# Patient Record
Sex: Female | Born: 1940 | Race: White | Hispanic: No | Marital: Married | Smoking: Former smoker
Health system: Southern US, Community
[De-identification: ages and names within clinical notes are randomized; demographics above are authoritative.]

## PROBLEM LIST (undated history)

## (undated) DIAGNOSIS — K529 Noninfective gastroenteritis and colitis, unspecified: Secondary | ICD-10-CM

## (undated) DIAGNOSIS — E119 Type 2 diabetes mellitus without complications: Secondary | ICD-10-CM

## (undated) DIAGNOSIS — E785 Hyperlipidemia, unspecified: Secondary | ICD-10-CM

## (undated) DIAGNOSIS — I10 Essential (primary) hypertension: Secondary | ICD-10-CM

## (undated) DIAGNOSIS — K219 Gastro-esophageal reflux disease without esophagitis: Secondary | ICD-10-CM

## (undated) DIAGNOSIS — N189 Chronic kidney disease, unspecified: Secondary | ICD-10-CM

## (undated) DIAGNOSIS — K828 Other specified diseases of gallbladder: Secondary | ICD-10-CM

## (undated) HISTORY — DX: Gastro-esophageal reflux disease without esophagitis: K21.9

## (undated) HISTORY — DX: Other specified diseases of gallbladder: K82.8

## (undated) HISTORY — DX: Essential (primary) hypertension: I10

## (undated) HISTORY — DX: Type 2 diabetes mellitus without complications: E11.9

## (undated) HISTORY — DX: Hyperlipidemia, unspecified: E78.5

## (undated) HISTORY — DX: Noninfective gastroenteritis and colitis, unspecified: K52.9

## (undated) HISTORY — DX: Chronic kidney disease, unspecified: N18.9

---

## 2012-09-09 ENCOUNTER — Emergency Department: Payer: Self-pay | Admitting: Emergency Medicine

## 2015-05-07 DIAGNOSIS — E7800 Pure hypercholesterolemia, unspecified: Secondary | ICD-10-CM | POA: Insufficient documentation

## 2016-07-21 DIAGNOSIS — E1129 Type 2 diabetes mellitus with other diabetic kidney complication: Secondary | ICD-10-CM | POA: Insufficient documentation

## 2016-08-04 DIAGNOSIS — K089 Disorder of teeth and supporting structures, unspecified: Secondary | ICD-10-CM | POA: Insufficient documentation

## 2017-08-10 DIAGNOSIS — N183 Chronic kidney disease, stage 3 unspecified: Secondary | ICD-10-CM | POA: Insufficient documentation

## 2018-05-17 DIAGNOSIS — S41102A Unspecified open wound of left upper arm, initial encounter: Secondary | ICD-10-CM | POA: Insufficient documentation

## 2018-09-20 DIAGNOSIS — L608 Other nail disorders: Secondary | ICD-10-CM | POA: Insufficient documentation

## 2018-10-27 DIAGNOSIS — Z7189 Other specified counseling: Secondary | ICD-10-CM | POA: Insufficient documentation

## 2019-12-21 ENCOUNTER — Other Ambulatory Visit: Payer: Self-pay

## 2019-12-21 ENCOUNTER — Ambulatory Visit: Payer: Medicare HMO | Admitting: Dermatology

## 2019-12-21 DIAGNOSIS — C44629 Squamous cell carcinoma of skin of left upper limb, including shoulder: Secondary | ICD-10-CM

## 2019-12-21 DIAGNOSIS — D0462 Carcinoma in situ of skin of left upper limb, including shoulder: Secondary | ICD-10-CM

## 2019-12-21 DIAGNOSIS — C4492 Squamous cell carcinoma of skin, unspecified: Secondary | ICD-10-CM

## 2019-12-21 DIAGNOSIS — C4491 Basal cell carcinoma of skin, unspecified: Secondary | ICD-10-CM

## 2019-12-21 DIAGNOSIS — C44619 Basal cell carcinoma of skin of left upper limb, including shoulder: Secondary | ICD-10-CM | POA: Diagnosis not present

## 2019-12-21 DIAGNOSIS — L821 Other seborrheic keratosis: Secondary | ICD-10-CM

## 2019-12-21 DIAGNOSIS — D485 Neoplasm of uncertain behavior of skin: Secondary | ICD-10-CM

## 2019-12-21 DIAGNOSIS — L578 Other skin changes due to chronic exposure to nonionizing radiation: Secondary | ICD-10-CM

## 2019-12-21 HISTORY — DX: Basal cell carcinoma of skin, unspecified: C44.91

## 2019-12-21 HISTORY — DX: Squamous cell carcinoma of skin, unspecified: C44.92

## 2019-12-21 MED ORDER — MUPIROCIN 2 % EX OINT: TOPICAL_OINTMENT | CUTANEOUS | 3 refills | Status: DC

## 2019-12-21 MED ORDER — DOXYCYCLINE HYCLATE 100 MG PO TABS: ORAL_TABLET | ORAL | 0 refills | Status: DC

## 2019-12-21 NOTE — Progress Notes (Signed)
New Patient Visit  Subjective  Michelle Guerrero is a 79 y.o. female who presents for the following: Skin Problem (Patient states that about a year ago she hit her left upper arm on a door and it still hasn't healed. She is being treated and follow by [facility]. Currently she is using a calcium alginate dressing on aa. ). She also has some crusted lesions on the left lower arm that she picks at and will not resolve.   Objective  Well appearing patient in no apparent distress; mood and affect are within normal limits.  Patient is very quiet and doesn't respond to questions.  Her daughter answers for her.  A focused examination was performed including the left arm. Relevant physical exam findings are noted in the Assessment and Plan.  Objective  left anterior shoulder/upper arm: 13.0 x 8.0 x 10.0 cm crusted hyperkeratotic  and friable deep ulceraton with raised, rolled borders.  Ulceration is down to muscle.      Objective  L mid forearm: 2.0 cm hyperkeratotic nodule with adjacent crusted hyperkeratotic plaque     Objective  L med forearm: 1 cm cutaneous horn     Assessment & Plan  Neoplasm of uncertain behavior of skin (3) left anterior shoulder/upper arm  Skin / nail biopsy Type of biopsy: tangential   Informed consent: discussed and consent obtained   Anesthesia: the lesion was anesthetized in a standard fashion   Anesthesia comment:  Area prepped with alcohol Anesthetic:  1% lidocaine w/ epinephrine 1-100,000 buffered w/ 8.4% NaHCO3 Instrument used: flexible razor blade   Hemostasis achieved with: pressure, aluminum chloride and electrodesiccation   Outcome: patient tolerated procedure well   Post-procedure details: wound care instructions given   Post-procedure details comment:  Ointment and small bandage applied Additional details:  Inferior edge of ulceration biopsied  doxycycline (VIBRA-TABS) 100 MG tablet  mupirocin ointment (BACTROBAN) 2 %  Specimen 1 - Surgical  pathology Differential Diagnosis: D48.5 SCC vs BCC Check Margins: No 13.0 x 8.0 x 10.0 cm crusted hyperkeratotic ulceraton  L mid forearm  Skin / nail biopsy Type of biopsy: tangential   Informed consent: discussed and consent obtained   Anesthesia: the lesion was anesthetized in a standard fashion   Anesthesia comment:  Area prepped with alcohol Anesthetic:  1% lidocaine w/ epinephrine 1-100,000 buffered w/ 8.4% NaHCO3 Instrument used: flexible razor blade   Hemostasis achieved with: pressure, aluminum chloride and electrodesiccation   Outcome: patient tolerated procedure well   Post-procedure details: wound care instructions given   Post-procedure details comment:  Ointment and small bandage applied Additional details:  Nodule biopsied- shave down to fat.  Adjacent plaque not biopsied  Specimen 2 - Surgical pathology Differential Diagnosis: D48.5 r/o SCC Check Margins: No 2.0 cm hyperkeratotic nodule  L med forearm  Skin / nail biopsy Type of biopsy: tangential   Informed consent: discussed and consent obtained   Anesthesia: the lesion was anesthetized in a standard fashion   Anesthesia comment:  Area prepped with alcohol Anesthetic:  1% lidocaine w/ epinephrine 1-100,000 buffered w/ 8.4% NaHCO3 Instrument used: flexible razor blade   Hemostasis achieved with: pressure, aluminum chloride and electrodesiccation   Outcome: patient tolerated procedure well   Post-procedure details: wound care instructions given   Post-procedure details comment:  Ointment and small bandage applied Additional details:  Cutaneous horn  Specimen 3 - Surgical pathology Differential Diagnosis: D48.5 r/o SCC Check Margins: No  Start Doxycycline 100mg  po BID take with food and plenty of  drink.  High risk of systemic infection due to depth/size of ulcer.  Start Mupirocin 2% ointment to aa's QD.  Will contact patient with pathology results and plan next steps pending those results.  If BCC,  consider Erivedge to shrink tumor.  According to daughter, patient has a h/o Mohs surgery on her nose, but never went back for the repair due to her fear of surgery, so she has a hole on her nose that she covers with a bandaid.  Patient declined examination of face today (wearing mask).   Will need to refer patient if skin cancer to [facility] for multidisciplinary cancer treatment evaluation. (Surg/Chemo/Radiation oncology).  Patient would like our office to contact her daughter [name] [phone].   Actinic Damage - diffuse scaly erythematous macules with underlying dyspigmentation - Recommend daily broad spectrum sunscreen SPF 30+ to sun-exposed areas, reapply every 2 hours as needed.  - Call for new or changing lesions.  Seborrheic Keratoses - Stuck-on, waxy, tan-brown papules and plaques  - Discussed benign etiology and prognosis. - Observe - Call for any changes  Return pending pathology results - will contact patient with next steps.   Luther Redo, CMA, am acting as scribe for Brendolyn Patty, MD .

## 2019-12-21 NOTE — Patient Instructions (Signed)

## 2019-12-26 ENCOUNTER — Other Ambulatory Visit: Payer: Self-pay

## 2019-12-26 ENCOUNTER — Telehealth: Payer: Self-pay

## 2019-12-26 DIAGNOSIS — C44619 Basal cell carcinoma of skin of left upper limb, including shoulder: Secondary | ICD-10-CM

## 2019-12-26 DIAGNOSIS — C4491 Basal cell carcinoma of skin, unspecified: Secondary | ICD-10-CM | POA: Insufficient documentation

## 2019-12-26 NOTE — Telephone Encounter (Signed)
Spoke to pts daughter and advised her of bx results.  Advised I sent referral to [facility] for them to evaluate and txt skin cancers.  Scheduled pt for 78m f/u./sh

## 2019-12-26 NOTE — Progress Notes (Signed)
Referral for Oncology.

## 2019-12-26 NOTE — Telephone Encounter (Signed)
-----   Message from Brendolyn Patty, MD sent at 12/25/2019  6:04 PM EDT ----- 1. 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 Michelle Guerrero- Patient needs appointment with oncologist at [facility] cancer center for treatment evaluation, possible Erivedge and/or radiation to shrink tumor. Possible surgery, but would be complicated due to large size and depth and may require plastics for reconstruction. 2. SCCs- multiple on L forearm.  Appointment as above- possible radiation due to extensive involvement. She will likely need a multidisciplinary team to address and I feel it is best for the cancer center to manage that process.  I would like her to also f/up with me in 2-3 months.

## 2020-01-01 ENCOUNTER — Telehealth: Payer: Self-pay | Admitting: *Deleted

## 2020-01-01 NOTE — Telephone Encounter (Signed)
Attempted to call patient multiple times to give appointment, no answer and voicemail full.   Appt. Printed and mailed.

## 2020-01-08 ENCOUNTER — Other Ambulatory Visit: Payer: Self-pay

## 2020-01-08 ENCOUNTER — Ambulatory Visit
Admission: RE | Admit: 2020-01-08 | Discharge: 2020-01-08 | Disposition: A | Discharge status: Home / Self Care | Payer: Medicare HMO | Source: Ambulatory Visit | Attending: Radiation Oncology | Admitting: Radiation Oncology

## 2020-01-08 ENCOUNTER — Encounter: Payer: Self-pay | Admitting: Radiation Oncology

## 2020-01-08 VITALS — BP 126/69 | HR 84 | Temp 96.9°F | Wt 179.0 lb

## 2020-01-08 DIAGNOSIS — C44619 Basal cell carcinoma of skin of left upper limb, including shoulder: Secondary | ICD-10-CM

## 2020-01-08 NOTE — Consult Note (Signed)
NEW PATIENT EVALUATION  Name: Michelle Guerrero  MRN: [id number]  Date:   01/08/2020     DOB: [birth year]   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  REFERRING PHYSICIAN: Nanda Quinton, MD  CHIEF COMPLAINT:  Chief Complaint  Patient presents with  . Consult    DIAGNOSIS: There were no encounter diagnoses.   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  She is a large ulcerative deep lesion in the left shoulder.  Patient is using appropriate medications although referred for possible radiation therapy to these areas.  PLANNED TREATMENT REGIMEN: Electron-beam therapy  PAST MEDICAL HISTORY:  has no past medical history on file.    PAST SURGICAL HISTORY:   FAMILY HISTORY: family history is not on file.  SOCIAL HISTORY:  reports that she has quit smoking. She has never used smokeless tobacco. She reports previous alcohol use. She reports previous drug use.  ALLERGIES: Penicillins and Montelukast  MEDICATIONS:  Current Outpatient Medications  Medication Sig Dispense Refill  . amLODipine (NORVASC) 5 MG tablet Take 5 mg by mouth daily.    . hydrochlorothiazide (HYDRODIURIL) 25 MG tablet Take 25 mg by mouth daily.    Marland Kitchen lisinopril (ZESTRIL) 40 MG tablet Take 1 tablet by mouth daily.    . mupirocin ointment (BACTROBAN) 2 % Apply to affected areas of wounds QD after cleaning. 30 g 3  . simvastatin (ZOCOR) 40 MG tablet Take 40 mg by mouth at bedtime.    . sodium bicarbonate 650 MG tablet Take 650 mg by mouth 2 (two) times daily.    Marland Kitchen doxycycline (VIBRA-TABS) 100 MG tablet Take one tab po BID for 2 weeks. Take with food. (Patient not taking: Reported on  01/08/2020) 28 tablet 0   No current facility-administered medications for this encounter.    ECOG PERFORMANCE STATUS:  1 - Symptomatic but completely ambulatory  REVIEW OF SYSTEMS: Patient denies any weight loss, fatigue, weakness, fever, chills or night sweats. Patient denies any loss of vision, blurred vision. Patient denies any ringing  of the ears or hearing loss. No irregular heartbeat. Patient denies heart murmur or history of fainting. Patient denies any chest pain or pain radiating to her upper extremities. Patient denies any shortness of breath, difficulty breathing at night, cough or hemoptysis. Patient denies any swelling in the lower legs. Patient denies any nausea vomiting, vomiting of blood, or coffee ground material in the vomitus. Patient denies any stomach pain. Patient states has had normal bowel movements no significant constipation or diarrhea. Patient denies any dysuria, hematuria or significant nocturia. Patient denies any problems walking, swelling in the joints or loss of balance. Patient denies any skin changes, loss of hair or loss of weight. Patient denies any excessive worrying or anxiety or significant depression. Patient denies any problems with insomnia. Patient denies excessive thirst, polyuria, polydipsia. Patient denies any swollen glands, patient denies easy bruising or easy bleeding. Patient denies any recent infections, allergies or URI. Patient "s visual fields have not changed significantly in recent time.   PHYSICAL EXAM: BP 126/69 (BP Location: Right Arm, Patient Position: Sitting, Cuff Size: Normal)   Pulse 84   Temp (!) 96.9 F (36.1 C) (Tympanic)  Wt 179 lb (81.2 kg)  Patient is an ulcerative large lesion measuring at least 10 cm greatest dimension.  Lesion is ulcerated down to fascia.  No evidence of supraclavicular or left axillary adenopathy is noted.  She also has several lesions on the left forearm recently biopsied again positive for squamous cell  carcinoma.  Well-developed well-nourished patient in NAD. HEENT reveals PERLA, EOMI, discs not visualized.  Oral cavity is clear. No oral mucosal lesions are identified. Neck is clear without evidence of cervical or supraclavicular adenopathy. Lungs are clear to A&P. Cardiac examination is essentially unremarkable with regular rate and rhythm without murmur rub or thrill. Abdomen is benign with no organomegaly or masses noted. Motor sensory and DTR levels are equal and symmetric in the upper and lower extremities. Cranial nerves II through XII are grossly intact. Proprioception is intact. No peripheral adenopathy or edema is identified. No motor or sensory levels are noted. Crude visual fields are within normal range.  LABORATORY DATA: Pathology report reviewed    RADIOLOGY RESULTS: No current films for review   IMPRESSION: Locally advanced basal cell carcinoma left shoulder as well as squamous cell carcinoma the left forearm in 79 year old female  PLAN: At this time like to attempt radiation therapy to both areas.  Would plan to delivering 5000 cGy to both areas and evaluate for response.  After 1 month would reevaluate both areas for possible additional radiation.  Risks and benefits of treatment including skin reaction fatigue alteration of blood counts possible lymphedema of her upper extremity all were described in detail to the patient and her daughter.  They both seem to comprehend my treatment plan well.  I personally set up and ordered CT simulation in 1 week.  I would like to take this opportunity to thank you for allowing me to participate in the care of your patient.Noreene Filbert, MD

## 2020-01-14 ENCOUNTER — Ambulatory Visit
Admission: RE | Admit: 2020-01-14 | Discharge: 2020-01-14 | Disposition: A | Discharge status: Home / Self Care | Payer: Medicare HMO | Source: Ambulatory Visit | Attending: Radiation Oncology | Admitting: Radiation Oncology

## 2020-01-14 DIAGNOSIS — C44619 Basal cell carcinoma of skin of left upper limb, including shoulder: Secondary | ICD-10-CM | POA: Insufficient documentation

## 2020-01-14 DIAGNOSIS — Z510 Encounter for antineoplastic radiation therapy: Secondary | ICD-10-CM | POA: Insufficient documentation

## 2020-01-16 DIAGNOSIS — Z510 Encounter for antineoplastic radiation therapy: Secondary | ICD-10-CM | POA: Diagnosis not present

## 2020-01-21 ENCOUNTER — Ambulatory Visit
Admission: RE | Admit: 2020-01-21 | Discharge: 2020-01-21 | Disposition: A | Discharge status: Home / Self Care | Payer: Medicare HMO | Source: Ambulatory Visit | Attending: Radiation Oncology | Admitting: Radiation Oncology

## 2020-01-21 DIAGNOSIS — Z510 Encounter for antineoplastic radiation therapy: Secondary | ICD-10-CM | POA: Diagnosis not present

## 2020-01-22 ENCOUNTER — Ambulatory Visit: Payer: Medicare HMO

## 2020-01-23 ENCOUNTER — Ambulatory Visit
Admission: RE | Admit: 2020-01-23 | Discharge: 2020-01-23 | Disposition: A | Discharge status: Home / Self Care | Payer: Medicare HMO | Source: Ambulatory Visit | Attending: Radiation Oncology | Admitting: Radiation Oncology

## 2020-01-23 DIAGNOSIS — Z510 Encounter for antineoplastic radiation therapy: Secondary | ICD-10-CM | POA: Diagnosis not present

## 2020-01-24 ENCOUNTER — Ambulatory Visit
Admission: RE | Admit: 2020-01-24 | Discharge: 2020-01-24 | Disposition: A | Discharge status: Home / Self Care | Payer: Medicare HMO | Source: Ambulatory Visit | Attending: Radiation Oncology | Admitting: Radiation Oncology

## 2020-01-24 DIAGNOSIS — Z510 Encounter for antineoplastic radiation therapy: Secondary | ICD-10-CM | POA: Diagnosis not present

## 2020-01-25 ENCOUNTER — Ambulatory Visit
Admission: RE | Admit: 2020-01-25 | Discharge: 2020-01-25 | Disposition: A | Discharge status: Home / Self Care | Payer: Medicare HMO | Source: Ambulatory Visit | Attending: Radiation Oncology | Admitting: Radiation Oncology

## 2020-01-25 DIAGNOSIS — Z510 Encounter for antineoplastic radiation therapy: Secondary | ICD-10-CM | POA: Diagnosis not present

## 2020-01-28 ENCOUNTER — Ambulatory Visit
Admission: RE | Admit: 2020-01-28 | Discharge: 2020-01-28 | Disposition: A | Discharge status: Home / Self Care | Payer: Medicare HMO | Source: Ambulatory Visit | Attending: Radiation Oncology | Admitting: Radiation Oncology

## 2020-01-28 DIAGNOSIS — Z510 Encounter for antineoplastic radiation therapy: Secondary | ICD-10-CM | POA: Diagnosis not present

## 2020-01-29 ENCOUNTER — Ambulatory Visit
Admission: RE | Admit: 2020-01-29 | Discharge: 2020-01-29 | Disposition: A | Discharge status: Home / Self Care | Payer: Medicare HMO | Source: Ambulatory Visit | Attending: Radiation Oncology | Admitting: Radiation Oncology

## 2020-01-29 DIAGNOSIS — Z510 Encounter for antineoplastic radiation therapy: Secondary | ICD-10-CM | POA: Diagnosis not present

## 2020-01-30 ENCOUNTER — Ambulatory Visit
Admission: RE | Admit: 2020-01-30 | Discharge: 2020-01-30 | Disposition: A | Discharge status: Home / Self Care | Payer: Medicare HMO | Source: Ambulatory Visit | Attending: Radiation Oncology | Admitting: Radiation Oncology

## 2020-01-30 DIAGNOSIS — Z510 Encounter for antineoplastic radiation therapy: Secondary | ICD-10-CM | POA: Diagnosis not present

## 2020-01-31 ENCOUNTER — Ambulatory Visit
Admission: RE | Admit: 2020-01-31 | Discharge: 2020-01-31 | Disposition: A | Discharge status: Home / Self Care | Payer: Medicare HMO | Source: Ambulatory Visit | Attending: Radiation Oncology | Admitting: Radiation Oncology

## 2020-01-31 DIAGNOSIS — Z510 Encounter for antineoplastic radiation therapy: Secondary | ICD-10-CM | POA: Diagnosis not present

## 2020-02-01 ENCOUNTER — Ambulatory Visit
Admission: RE | Admit: 2020-02-01 | Discharge: 2020-02-01 | Disposition: A | Discharge status: Home / Self Care | Payer: Medicare HMO | Source: Ambulatory Visit | Attending: Radiation Oncology | Admitting: Radiation Oncology

## 2020-02-01 DIAGNOSIS — Z510 Encounter for antineoplastic radiation therapy: Secondary | ICD-10-CM | POA: Diagnosis not present

## 2020-02-05 ENCOUNTER — Ambulatory Visit
Admission: RE | Admit: 2020-02-05 | Discharge: 2020-02-05 | Disposition: A | Discharge status: Home / Self Care | Payer: Medicare HMO | Source: Ambulatory Visit | Attending: Radiation Oncology | Admitting: Radiation Oncology

## 2020-02-05 DIAGNOSIS — C44619 Basal cell carcinoma of skin of left upper limb, including shoulder: Secondary | ICD-10-CM | POA: Insufficient documentation

## 2020-02-05 DIAGNOSIS — Z510 Encounter for antineoplastic radiation therapy: Secondary | ICD-10-CM | POA: Diagnosis not present

## 2020-02-06 ENCOUNTER — Ambulatory Visit
Admission: RE | Admit: 2020-02-06 | Discharge: 2020-02-06 | Disposition: A | Discharge status: Home / Self Care | Payer: Medicare HMO | Source: Ambulatory Visit | Attending: Radiation Oncology | Admitting: Radiation Oncology

## 2020-02-06 DIAGNOSIS — Z510 Encounter for antineoplastic radiation therapy: Secondary | ICD-10-CM | POA: Diagnosis not present

## 2020-02-07 ENCOUNTER — Ambulatory Visit
Admission: RE | Admit: 2020-02-07 | Discharge: 2020-02-07 | Disposition: A | Discharge status: Home / Self Care | Payer: Medicare HMO | Source: Ambulatory Visit | Attending: Radiation Oncology | Admitting: Radiation Oncology

## 2020-02-07 DIAGNOSIS — Z510 Encounter for antineoplastic radiation therapy: Secondary | ICD-10-CM | POA: Diagnosis not present

## 2020-02-08 ENCOUNTER — Ambulatory Visit
Admission: RE | Admit: 2020-02-08 | Discharge: 2020-02-08 | Disposition: A | Discharge status: Home / Self Care | Payer: Medicare HMO | Source: Ambulatory Visit | Attending: Radiation Oncology | Admitting: Radiation Oncology

## 2020-02-08 DIAGNOSIS — Z510 Encounter for antineoplastic radiation therapy: Secondary | ICD-10-CM | POA: Diagnosis not present

## 2020-02-11 ENCOUNTER — Ambulatory Visit
Admission: RE | Admit: 2020-02-11 | Discharge: 2020-02-11 | Disposition: A | Discharge status: Home / Self Care | Payer: Medicare HMO | Source: Ambulatory Visit | Attending: Radiation Oncology | Admitting: Radiation Oncology

## 2020-02-11 DIAGNOSIS — Z510 Encounter for antineoplastic radiation therapy: Secondary | ICD-10-CM | POA: Diagnosis not present

## 2020-02-12 ENCOUNTER — Ambulatory Visit
Admission: RE | Admit: 2020-02-12 | Discharge: 2020-02-12 | Disposition: A | Discharge status: Home / Self Care | Payer: Medicare HMO | Source: Ambulatory Visit | Attending: Radiation Oncology | Admitting: Radiation Oncology

## 2020-02-12 DIAGNOSIS — Z510 Encounter for antineoplastic radiation therapy: Secondary | ICD-10-CM | POA: Diagnosis not present

## 2020-02-13 ENCOUNTER — Ambulatory Visit
Admission: RE | Admit: 2020-02-13 | Discharge: 2020-02-13 | Disposition: A | Discharge status: Home / Self Care | Payer: Medicare HMO | Source: Ambulatory Visit | Attending: Radiation Oncology | Admitting: Radiation Oncology

## 2020-02-13 DIAGNOSIS — Z510 Encounter for antineoplastic radiation therapy: Secondary | ICD-10-CM | POA: Diagnosis not present

## 2020-02-14 ENCOUNTER — Ambulatory Visit
Admission: RE | Admit: 2020-02-14 | Discharge: 2020-02-14 | Disposition: A | Discharge status: Home / Self Care | Payer: Medicare HMO | Source: Ambulatory Visit | Attending: Radiation Oncology | Admitting: Radiation Oncology

## 2020-02-14 DIAGNOSIS — Z510 Encounter for antineoplastic radiation therapy: Secondary | ICD-10-CM | POA: Diagnosis not present

## 2020-02-15 ENCOUNTER — Ambulatory Visit
Admission: RE | Admit: 2020-02-15 | Discharge: 2020-02-15 | Disposition: A | Discharge status: Home / Self Care | Payer: Medicare HMO | Source: Ambulatory Visit | Attending: Radiation Oncology | Admitting: Radiation Oncology

## 2020-02-15 DIAGNOSIS — Z510 Encounter for antineoplastic radiation therapy: Secondary | ICD-10-CM | POA: Diagnosis not present

## 2020-02-17 ENCOUNTER — Ambulatory Visit: Payer: Medicare HMO

## 2020-02-18 ENCOUNTER — Ambulatory Visit: Payer: Self-pay | Admitting: Dermatology

## 2020-02-18 ENCOUNTER — Ambulatory Visit
Admission: RE | Admit: 2020-02-18 | Discharge: 2020-02-18 | Disposition: A | Discharge status: Home / Self Care | Payer: Medicare HMO | Source: Ambulatory Visit | Attending: Radiation Oncology | Admitting: Radiation Oncology

## 2020-02-18 DIAGNOSIS — Z510 Encounter for antineoplastic radiation therapy: Secondary | ICD-10-CM | POA: Diagnosis not present

## 2020-02-19 ENCOUNTER — Ambulatory Visit
Admission: RE | Admit: 2020-02-19 | Discharge: 2020-02-19 | Disposition: A | Discharge status: Home / Self Care | Payer: Medicare HMO | Source: Ambulatory Visit | Attending: Radiation Oncology | Admitting: Radiation Oncology

## 2020-02-19 DIAGNOSIS — Z510 Encounter for antineoplastic radiation therapy: Secondary | ICD-10-CM | POA: Diagnosis not present

## 2020-02-20 ENCOUNTER — Ambulatory Visit
Admission: RE | Admit: 2020-02-20 | Discharge: 2020-02-20 | Disposition: A | Discharge status: Home / Self Care | Payer: Medicare HMO | Source: Ambulatory Visit | Attending: Radiation Oncology | Admitting: Radiation Oncology

## 2020-02-20 DIAGNOSIS — Z510 Encounter for antineoplastic radiation therapy: Secondary | ICD-10-CM | POA: Diagnosis not present

## 2020-02-21 ENCOUNTER — Ambulatory Visit
Admission: RE | Admit: 2020-02-21 | Discharge: 2020-02-21 | Disposition: A | Discharge status: Home / Self Care | Payer: Medicare HMO | Source: Ambulatory Visit | Attending: Radiation Oncology | Admitting: Radiation Oncology

## 2020-02-21 DIAGNOSIS — Z510 Encounter for antineoplastic radiation therapy: Secondary | ICD-10-CM | POA: Diagnosis not present

## 2020-02-22 ENCOUNTER — Ambulatory Visit
Admission: RE | Admit: 2020-02-22 | Discharge: 2020-02-22 | Disposition: A | Discharge status: Home / Self Care | Payer: Medicare HMO | Source: Ambulatory Visit | Attending: Radiation Oncology | Admitting: Radiation Oncology

## 2020-02-22 DIAGNOSIS — Z510 Encounter for antineoplastic radiation therapy: Secondary | ICD-10-CM | POA: Diagnosis not present

## 2020-02-25 ENCOUNTER — Ambulatory Visit: Payer: Medicare HMO

## 2020-02-25 ENCOUNTER — Ambulatory Visit
Admission: RE | Admit: 2020-02-25 | Discharge: 2020-02-25 | Disposition: A | Discharge status: Home / Self Care | Payer: Medicare HMO | Source: Ambulatory Visit | Attending: Radiation Oncology | Admitting: Radiation Oncology

## 2020-02-25 DIAGNOSIS — Z510 Encounter for antineoplastic radiation therapy: Secondary | ICD-10-CM | POA: Diagnosis not present

## 2020-02-26 ENCOUNTER — Ambulatory Visit
Admission: RE | Admit: 2020-02-26 | Discharge: 2020-02-26 | Disposition: A | Discharge status: Home / Self Care | Payer: Medicare HMO | Source: Ambulatory Visit | Attending: Radiation Oncology | Admitting: Radiation Oncology

## 2020-02-26 DIAGNOSIS — Z510 Encounter for antineoplastic radiation therapy: Secondary | ICD-10-CM | POA: Diagnosis not present

## 2020-03-13 ENCOUNTER — Ambulatory Visit: Payer: Medicare HMO | Admitting: Dermatology

## 2020-04-02 ENCOUNTER — Other Ambulatory Visit: Payer: Self-pay

## 2020-04-02 DIAGNOSIS — I10 Essential (primary) hypertension: Secondary | ICD-10-CM | POA: Insufficient documentation

## 2020-04-02 DIAGNOSIS — Z85828 Personal history of other malignant neoplasm of skin: Secondary | ICD-10-CM | POA: Insufficient documentation

## 2020-04-03 ENCOUNTER — Encounter: Payer: Self-pay | Admitting: Radiation Oncology

## 2020-04-03 ENCOUNTER — Ambulatory Visit
Admission: RE | Admit: 2020-04-03 | Discharge: 2020-04-03 | Disposition: A | Discharge status: Home / Self Care | Payer: Medicare HMO | Source: Ambulatory Visit | Attending: Radiation Oncology | Admitting: Radiation Oncology

## 2020-04-03 VITALS — BP 145/59 | HR 92 | Temp 97.9°F | Wt 181.4 lb

## 2020-04-03 DIAGNOSIS — Z923 Personal history of irradiation: Secondary | ICD-10-CM | POA: Diagnosis not present

## 2020-04-03 DIAGNOSIS — C44619 Basal cell carcinoma of skin of left upper limb, including shoulder: Secondary | ICD-10-CM | POA: Diagnosis not present

## 2020-04-03 NOTE — Progress Notes (Signed)
Radiation Oncology Follow up Note  Name: Michelle Guerrero   Date:   04/03/2020 MRN:  [id number] DOB: 15-Sep-1940    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  REFERRING PROVIDER: Nanda Quinton, MD  HPI: Patient is a 79 year old female now seen at 1 month having completed radiation therapy to her left upper extremity she had a large ulcerated mass in the left anterior shoulder as well as left forearm for skin cancer as described above.  She is doing well pain has resolved.  She has an open wound with attempted granulation tissue.  The forearm lesion lesion is has granulation tissue present both areas of had a good response..  COMPLICATIONS OF TREATMENT: none  FOLLOW UP COMPLIANCE: keeps appointments   PHYSICAL EXAM:  BP (!) 145/59 (BP Location: Right Arm, Patient Position: Sitting, Cuff Size: Normal)   Pulse 92   Temp 97.9 F (36.6 C) (Tympanic)   Wt 181 lb 6.4 oz (82.3 kg)  Patient is an open wound in the left upper shoulder with crusting around the perimeter of the lesion consistent with treatment response.  Well-developed well-nourished patient in NAD. HEENT reveals PERLA, EOMI, discs not visualized.  Oral cavity is clear. No oral mucosal lesions are identified. Neck is clear without evidence of cervical or supraclavicular adenopathy. Lungs are clear to A&P. Cardiac examination is essentially unremarkable with regular rate and rhythm without murmur rub or thrill. Abdomen is benign with no organomegaly or masses noted. Motor sensory and DTR levels are equal and symmetric in the upper and lower extremities. Cranial nerves II through XII are grossly intact. Proprioception is intact. No peripheral adenopathy or edema is identified. No motor or sensory levels are noted. Crude visual fields are within normal range.  RADIOLOGY RESULTS: No  current films to review  PLAN: This time patient will continue follow-up care with her dermatologist.  I have recommended possible wound clinic for this woman will let that be recommendation of dermatology.  I have asked to see her back in 3 months she knows to call should she see any progression of disease at this time.  I suggested she keep the area nonbandaged to promote healing.  I would like to take this opportunity to thank you for allowing me to participate in the care of your patient.Noreene Filbert, MD

## 2020-04-08 ENCOUNTER — Other Ambulatory Visit: Payer: Self-pay

## 2020-04-08 ENCOUNTER — Ambulatory Visit: Payer: Medicare HMO | Admitting: Dermatology

## 2020-04-08 ENCOUNTER — Encounter: Payer: Self-pay | Admitting: Dermatology

## 2020-04-08 DIAGNOSIS — C44619 Basal cell carcinoma of skin of left upper limb, including shoulder: Secondary | ICD-10-CM | POA: Diagnosis not present

## 2020-04-08 DIAGNOSIS — C44629 Squamous cell carcinoma of skin of left upper limb, including shoulder: Secondary | ICD-10-CM | POA: Diagnosis not present

## 2020-04-08 DIAGNOSIS — L98499 Non-pressure chronic ulcer of skin of other sites with unspecified severity: Secondary | ICD-10-CM

## 2020-04-08 DIAGNOSIS — D485 Neoplasm of uncertain behavior of skin: Secondary | ICD-10-CM

## 2020-04-08 DIAGNOSIS — C4492 Squamous cell carcinoma of skin, unspecified: Secondary | ICD-10-CM

## 2020-04-08 MED ORDER — MUPIROCIN 2 % EX OINT: TOPICAL_OINTMENT | CUTANEOUS | 4 refills | Status: AC

## 2020-04-08 MED ORDER — DOXYCYCLINE MONOHYDRATE 100 MG PO TABS: 100.0000 mg | ORAL_TABLET | Freq: Two times a day (BID) | ORAL | 1 refills | Status: DC

## 2020-04-08 NOTE — Patient Instructions (Addendum)
Recommend daily broad spectrum sunscreen SPF 30+ to sun-exposed areas, reapply every 2 hours as needed. Call for new or changing lesions.    [employer] Wound Cleanser 16 oz. NonSterile Spray Bottle 1 Each

## 2020-04-08 NOTE — Progress Notes (Signed)
   Follow-Up Visit   Subjective  Michelle Guerrero is a 79 y.o. female who presents for the following: Follow-up (BCC/SCC post radiation treatment).  Patient presents today for follow up [place] and SCC post radiation treatment on Left arm.  The following portions of the chart were reviewed this encounter and updated as appropriate:      Review of Systems:  No other skin or systemic complaints except as noted in HPI or Assessment and Plan.  Objective  Well appearing patient in no apparent distress; mood and affect are within normal limits.  A focused examination was performed including Left Arm. Relevant physical exam findings are noted in the Assessment and Plan.  Objective  Left Upper anterior shoudler /upper arm: Large ulceration 10 cm with thick adherent yellow crusting at edges     Objective  Left Forearm - Anterior: Erythma with adjacent think yellow brown crusting 5 cm      Assessment & Plan  Basal cell carcinoma (BCC) of skin of left upper extremity including shoulder Left Upper anterior shoudler /upper arm  doxycycline (ADOXA) 100 MG tablet  Improving post radiation, large Ulceration starting to heal with secondary infection at edges Start Doxycycline 100 mg PO bid  x 2 weeks Start Mupirocin ointment bid  Apply warm wet compress twice daily let sit for 30 mins and rub what comes off, do not pull crust off, don't make bleed then apply mupirocin around wound where crusted and then cover inside of wound with the calcium alginate then cover with ABD pad, gauze and then coban  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   Marene Lenz, CMA, am acting as scribe for Brendolyn Patty, MD .  Documentation: I have reviewed the above documentation for accuracy and completeness, and I agree with the above.  Brendolyn Patty MD

## 2020-05-13 ENCOUNTER — Ambulatory Visit: Payer: Medicare HMO | Admitting: Dermatology

## 2020-05-13 ENCOUNTER — Other Ambulatory Visit: Payer: Self-pay

## 2020-05-13 DIAGNOSIS — C44629 Squamous cell carcinoma of skin of left upper limb, including shoulder: Secondary | ICD-10-CM | POA: Diagnosis not present

## 2020-05-13 DIAGNOSIS — C4492 Squamous cell carcinoma of skin, unspecified: Secondary | ICD-10-CM

## 2020-05-13 DIAGNOSIS — C44619 Basal cell carcinoma of skin of left upper limb, including shoulder: Secondary | ICD-10-CM

## 2020-05-13 DIAGNOSIS — Z85828 Personal history of other malignant neoplasm of skin: Secondary | ICD-10-CM | POA: Diagnosis not present

## 2020-05-13 NOTE — Progress Notes (Signed)
   Follow-Up Visit   Subjective  Michelle Guerrero is a 79 y.o. female who presents for the following: Hx BCC/SCC (L ant shoulder, L mid forearms radiation with Dr. Baruch Gouty, using mupirocin ointment and calcium alginate). Finished 2 week course of Doxycycline.  Some large crusts have come off. Pt presents with daughter.  The following portions of the chart were reviewed this encounter and updated as appropriate:      Review of Systems:  No other skin or systemic complaints except as noted in HPI or Assessment and Plan.  Objective  Well appearing patient in no apparent distress; mood and affect are within normal limits.  A focused examination was performed including L arm. Relevant physical exam findings are noted in the Assessment and Plan.  Objective  Left upper arm/anterior shoulder: Large ulceration with surrounding hyperkeratosis.  Improving when compared to photo previous visit. No evidence of infection.     Objective  Left Forearm - Anterior: Pink patch with hyperkeratosis and mild crusting      Assessment & Plan  Basal cell carcinoma (BCC) of skin of left upper extremity including shoulder Left upper arm/anterior shoulder  Large ulceration. Healing well. Bx proven with hx of recent Radiation course, pt will f/u with Dr. 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 Baruch Gouty. Pt is scheduled to f/u with Dr. Baruch Gouty 07/07/20  Cont Mupirocin oint qd and cover  Return in about 1 month (around 06/12/2020) for healing ulceration.  I, Othelia Pulling, RMA, am acting as scribe for Brendolyn Patty, MD . Documentation: I have reviewed the above documentation for accuracy and completeness, and I agree with  the above.  Brendolyn Patty MD

## 2020-06-03 ENCOUNTER — Ambulatory Visit: Payer: Medicare HMO | Admitting: Dermatology

## 2020-06-17 ENCOUNTER — Emergency Department: Payer: Medicare HMO

## 2020-06-17 ENCOUNTER — Inpatient Hospital Stay
Admission: EM | Admit: 2020-06-17 | Discharge: 2020-07-04 | DRG: 392 | Disposition: A | Discharge status: Skilled Nursing Facility | Payer: Medicare HMO | Attending: Internal Medicine | Admitting: Internal Medicine

## 2020-06-17 ENCOUNTER — Other Ambulatory Visit: Payer: Self-pay

## 2020-06-17 DIAGNOSIS — Z79899 Other long term (current) drug therapy: Secondary | ICD-10-CM

## 2020-06-17 DIAGNOSIS — E785 Hyperlipidemia, unspecified: Secondary | ICD-10-CM | POA: Diagnosis present

## 2020-06-17 DIAGNOSIS — E1129 Type 2 diabetes mellitus with other diabetic kidney complication: Secondary | ICD-10-CM | POA: Diagnosis present

## 2020-06-17 DIAGNOSIS — M79606 Pain in leg, unspecified: Secondary | ICD-10-CM

## 2020-06-17 DIAGNOSIS — C23 Malignant neoplasm of gallbladder: Secondary | ICD-10-CM | POA: Diagnosis present

## 2020-06-17 DIAGNOSIS — Z888 Allergy status to other drugs, medicaments and biological substances status: Secondary | ICD-10-CM

## 2020-06-17 DIAGNOSIS — K5730 Diverticulosis of large intestine without perforation or abscess without bleeding: Secondary | ICD-10-CM | POA: Diagnosis present

## 2020-06-17 DIAGNOSIS — E1122 Type 2 diabetes mellitus with diabetic chronic kidney disease: Secondary | ICD-10-CM | POA: Diagnosis present

## 2020-06-17 DIAGNOSIS — R531 Weakness: Secondary | ICD-10-CM

## 2020-06-17 DIAGNOSIS — Z96642 Presence of left artificial hip joint: Secondary | ICD-10-CM | POA: Diagnosis present

## 2020-06-17 DIAGNOSIS — I959 Hypotension, unspecified: Secondary | ICD-10-CM | POA: Diagnosis present

## 2020-06-17 DIAGNOSIS — Z85828 Personal history of other malignant neoplasm of skin: Secondary | ICD-10-CM

## 2020-06-17 DIAGNOSIS — D729 Disorder of white blood cells, unspecified: Secondary | ICD-10-CM

## 2020-06-17 DIAGNOSIS — N189 Chronic kidney disease, unspecified: Secondary | ICD-10-CM

## 2020-06-17 DIAGNOSIS — I129 Hypertensive chronic kidney disease with stage 1 through stage 4 chronic kidney disease, or unspecified chronic kidney disease: Secondary | ICD-10-CM | POA: Diagnosis present

## 2020-06-17 DIAGNOSIS — D72828 Other elevated white blood cell count: Secondary | ICD-10-CM

## 2020-06-17 DIAGNOSIS — M25569 Pain in unspecified knee: Secondary | ICD-10-CM

## 2020-06-17 DIAGNOSIS — N184 Chronic kidney disease, stage 4 (severe): Secondary | ICD-10-CM | POA: Diagnosis present

## 2020-06-17 DIAGNOSIS — Z20822 Contact with and (suspected) exposure to covid-19: Secondary | ICD-10-CM | POA: Diagnosis present

## 2020-06-17 DIAGNOSIS — N179 Acute kidney failure, unspecified: Secondary | ICD-10-CM

## 2020-06-17 DIAGNOSIS — D72829 Elevated white blood cell count, unspecified: Secondary | ICD-10-CM

## 2020-06-17 DIAGNOSIS — K529 Noninfective gastroenteritis and colitis, unspecified: Secondary | ICD-10-CM | POA: Diagnosis not present

## 2020-06-17 DIAGNOSIS — K219 Gastro-esophageal reflux disease without esophagitis: Secondary | ICD-10-CM | POA: Diagnosis present

## 2020-06-17 DIAGNOSIS — R809 Proteinuria, unspecified: Secondary | ICD-10-CM | POA: Diagnosis present

## 2020-06-17 DIAGNOSIS — I10 Essential (primary) hypertension: Secondary | ICD-10-CM | POA: Diagnosis present

## 2020-06-17 DIAGNOSIS — K828 Other specified diseases of gallbladder: Secondary | ICD-10-CM

## 2020-06-17 DIAGNOSIS — M25562 Pain in left knee: Secondary | ICD-10-CM

## 2020-06-17 DIAGNOSIS — Z87891 Personal history of nicotine dependence: Secondary | ICD-10-CM

## 2020-06-17 DIAGNOSIS — R059 Cough, unspecified: Secondary | ICD-10-CM

## 2020-06-17 DIAGNOSIS — Z880 Allergy status to penicillin: Secondary | ICD-10-CM

## 2020-06-17 DIAGNOSIS — R9389 Abnormal findings on diagnostic imaging of other specified body structures: Secondary | ICD-10-CM

## 2020-06-17 DIAGNOSIS — Z982 Presence of cerebrospinal fluid drainage device: Secondary | ICD-10-CM

## 2020-06-17 DIAGNOSIS — R519 Headache, unspecified: Secondary | ICD-10-CM | POA: Diagnosis not present

## 2020-06-17 DIAGNOSIS — R112 Nausea with vomiting, unspecified: Secondary | ICD-10-CM

## 2020-06-17 LAB — CBC
HCT: 37.7 % (ref 36.0–46.0)
Hemoglobin: 12 g/dL (ref 12.0–15.0)
MCH: 29.8 pg (ref 26.0–34.0)
MCHC: 31.8 g/dL (ref 30.0–36.0)
MCV: 93.5 fL (ref 80.0–100.0)
Platelets: 528 10*3/uL — ABNORMAL HIGH (ref 150–400)
RBC: 4.03 MIL/uL (ref 3.87–5.11)
RDW: 15.6 % — ABNORMAL HIGH (ref 11.5–15.5)
WBC: 23.3 10*3/uL — ABNORMAL HIGH (ref 4.0–10.5)
nRBC: 0 % (ref 0.0–0.2)

## 2020-06-17 LAB — COMPREHENSIVE METABOLIC PANEL
ALT: 12 U/L (ref 0–44)
AST: 19 U/L (ref 15–41)
Albumin: 3.5 g/dL (ref 3.5–5.0)
Alkaline Phosphatase: 124 U/L (ref 38–126)
Anion gap: 15 (ref 5–15)
BUN: 40 mg/dL — ABNORMAL HIGH (ref 8–23)
CO2: 20 mmol/L — ABNORMAL LOW (ref 22–32)
Calcium: 9.2 mg/dL (ref 8.9–10.3)
Chloride: 101 mmol/L (ref 98–111)
Creatinine, Ser: 1.83 mg/dL — ABNORMAL HIGH (ref 0.44–1.00)
GFR, Estimated: 26 mL/min — ABNORMAL LOW (ref >60)
Glucose, Bld: 100 mg/dL — ABNORMAL HIGH (ref 70–99)
Potassium: 3.7 mmol/L (ref 3.5–5.1)
Sodium: 136 mmol/L (ref 135–145)
Total Bilirubin: 0.8 mg/dL (ref 0.3–1.2)
Total Protein: 8.3 g/dL — ABNORMAL HIGH (ref 6.5–8.1)

## 2020-06-17 IMAGING — CT CT ABD-PELV W/O CM
2 of 5 series · 14 of 46 positions shown, 16 images · non-contrast
Comparison: None.

CLINICAL DATA: Emesis and diarrhea, recent hip fracture

EXAM:
CT ABDOMEN AND PELVIS WITHOUT CONTRAST
TECHNIQUE: Multidetector CT imaging of the abdomen and pelvis was performed
following the standard protocol without IV contrast.

[Series 2: routine abd/(person_name) (person_name) · axial · 0.88mm/px · z∈[-441,-56]mm · 11 of 93 slices shown, 13 images]
[im 8/93  soft-tissue]
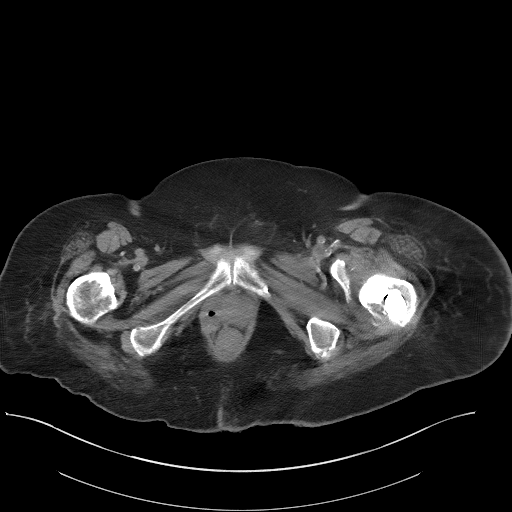
[im 8/93  bone]
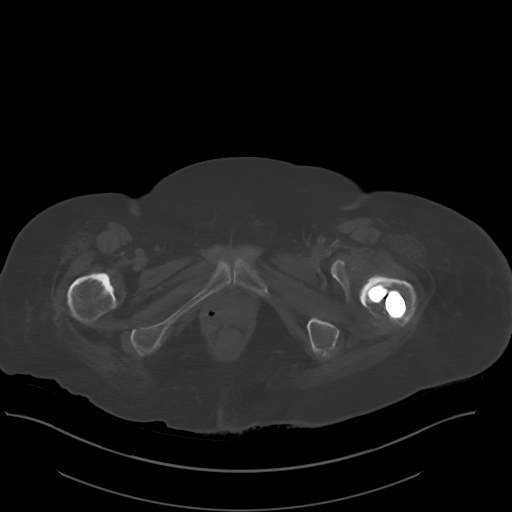
[im 15/93  soft-tissue]
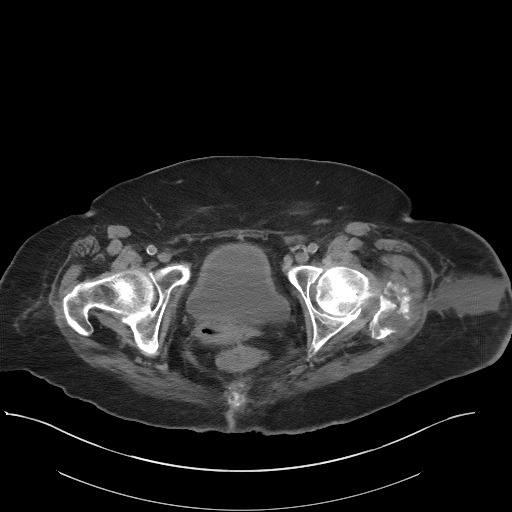
[im 23/93  soft-tissue]
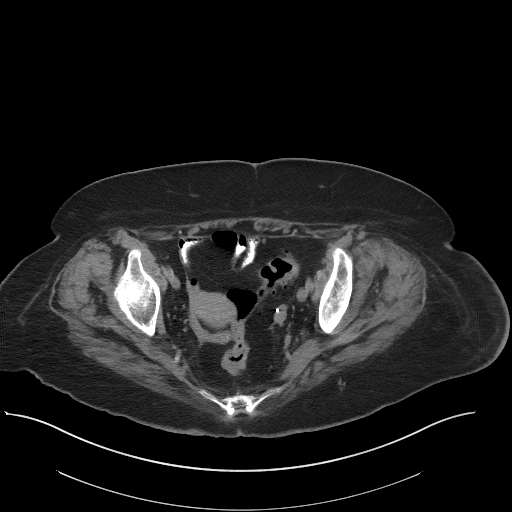
[im 30/93  soft-tissue]
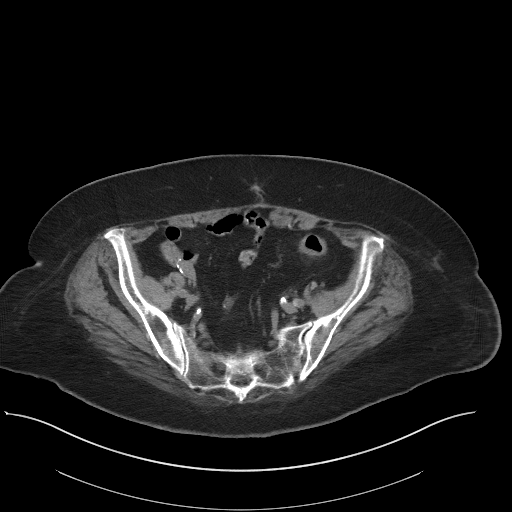
[im 37/93  soft-tissue]
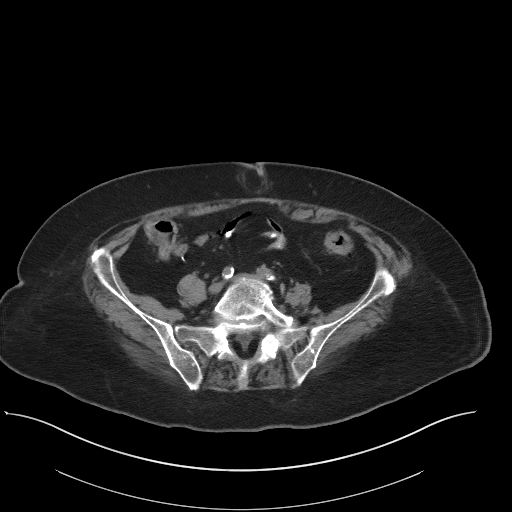
[im 48/93  soft-tissue]
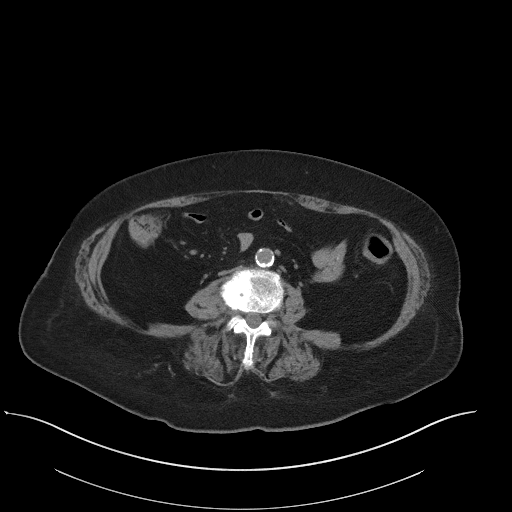
[im 56/93  soft-tissue]
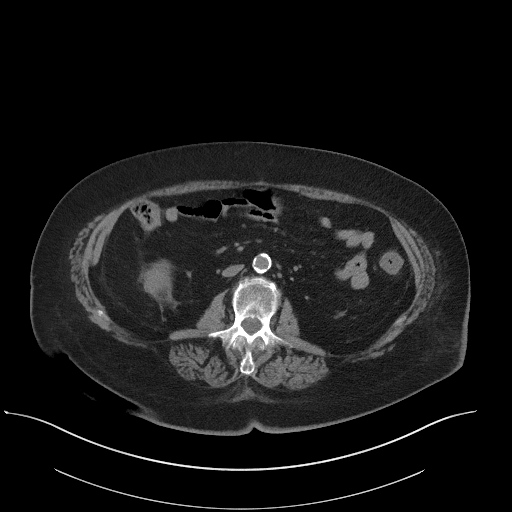
[im 63/93  soft-tissue]
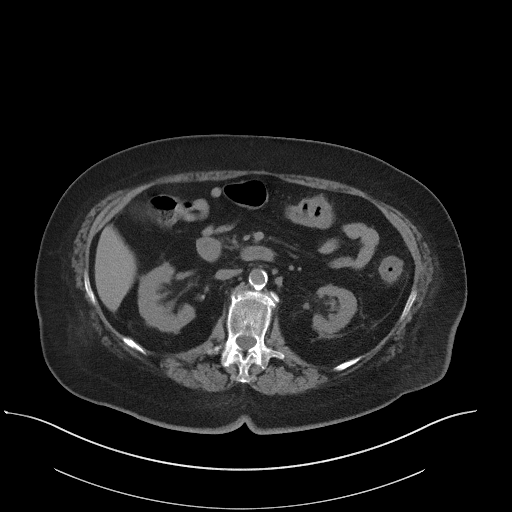
[im 70/93  soft-tissue]
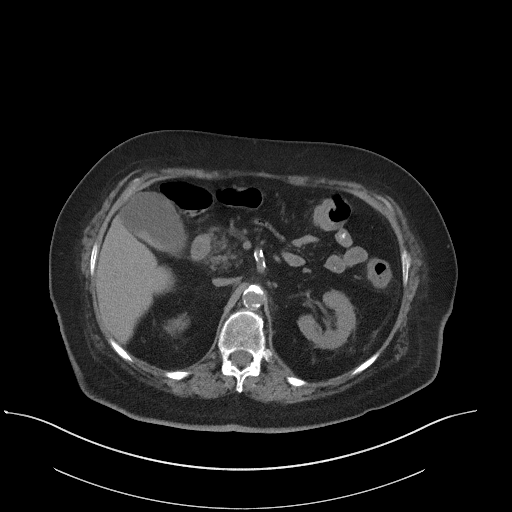
[im 70/93  bone]
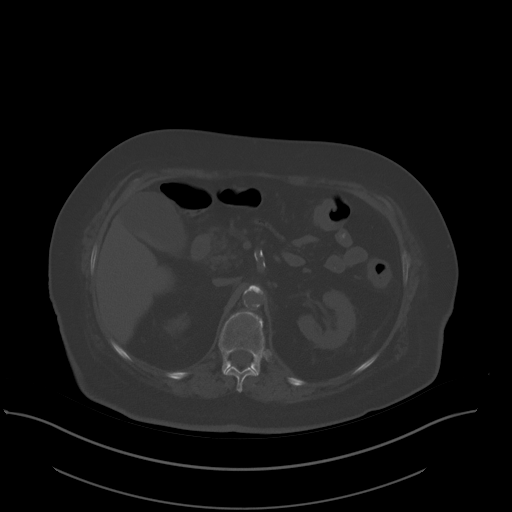
[im 78/93  soft-tissue]
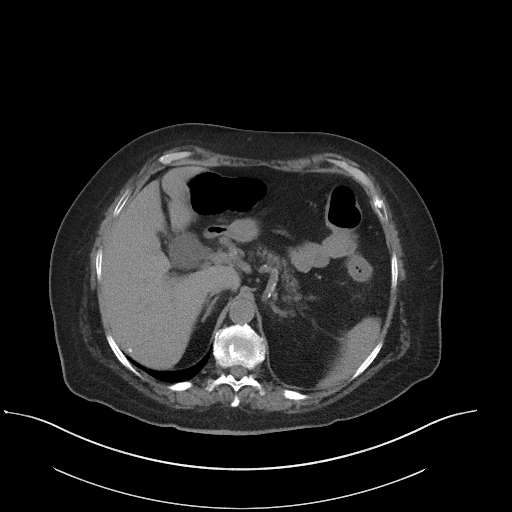
[im 85/93  soft-tissue]
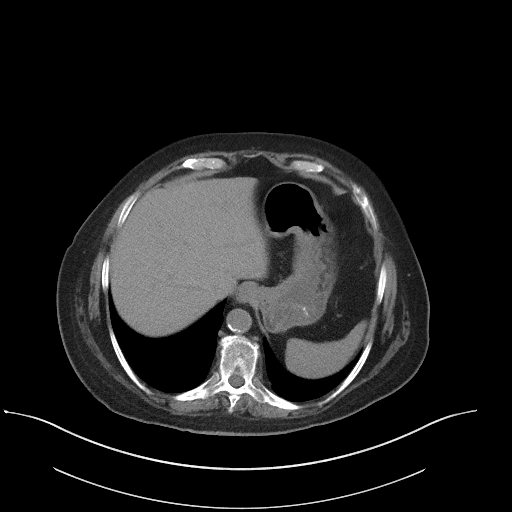

[Series 6: sagittal st · coronal · 0.83mm/px · 3 of 93 slices shown]
[im 24/93  soft-tissue]
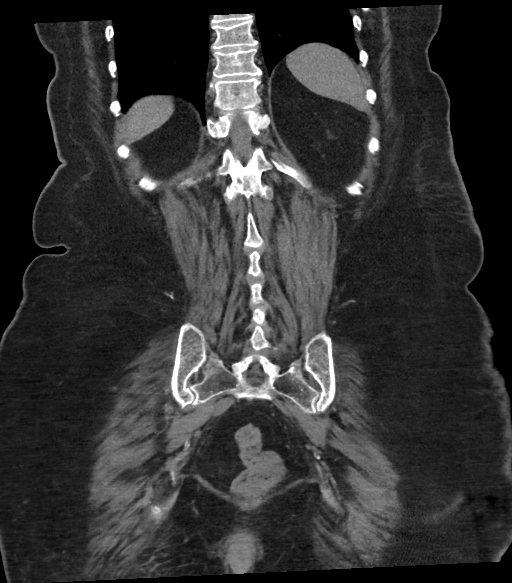
[im 47/93  soft-tissue]
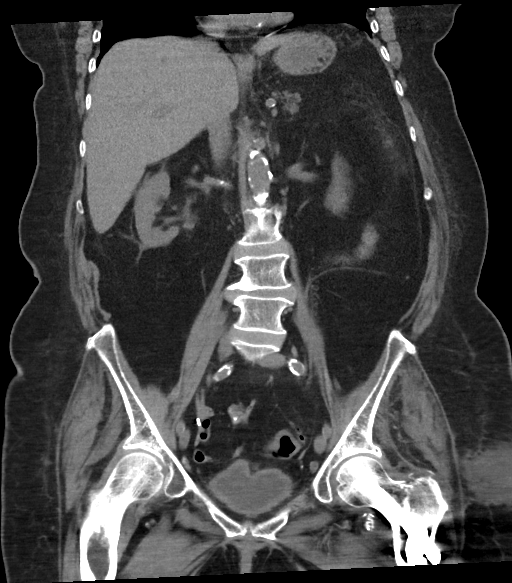
[im 70/93  soft-tissue]
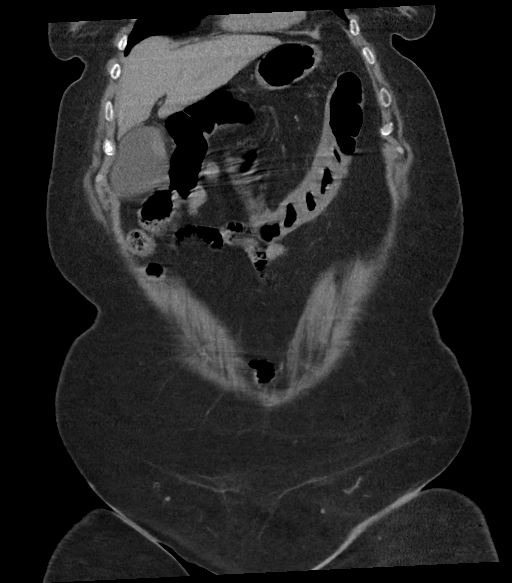

[14 of 46 positions shown; findings below may reference images not displayed]

FINDINGS: Lower chest: Motion degraded imaging of the lung bases. Some
dependent atelectasis posteriorly. Normal heart size. No pericardial
effusion. Coronary artery calcifications are present.

Hepatobiliary: Few punctate calcifications within the liver may
reflect sequela of prior granulomatous disease. No focal concerning
liver lesion on this unenhanced CT. Smooth liver surface contour.
Normal liver attenuation. There is nodular attenuation along the
gallbladder wall which could reflect adherent biliary sludge or
discrete thickening of the gallbladder wall itself. Mild surrounding
pericholecystic inflammation is noted as well. No visible calcified
gallstone. No biliary ductal dilatation.

Pancreas: Partial fatty replacement of the pancreas. No pancreatic
ductal dilatation or surrounding inflammatory changes.

Spleen: Normal in size. No concerning splenic lesions.

Adrenals/Urinary Tract: Normal adrenal glands. No visible concerning
renal lesion. Moderate bilateral perinephric stranding is
nonspecific. No urolithiasis or hydronephrosis. Urinary bladder is
largely decompressed at the time of exam and therefore poorly
evaluated by CT imaging. Circumferential bladder wall thickening is
noted which is nonspecific given underdistention though with
perivesicular hazy stranding does raise concern for possible
cystitis.

Stomach/Bowel: Distal esophagus and stomach are unremarkable. No
small bowel thickening or dilatation. The appendix is surgically
absent. Long segmental thickening of the colon pericolonic hazy
stranding is seen from the mid transverse to the distal sigmoid
which does not appear focally centered upon a culprit diverticulum
despite the presence of numerous distal colonic diverticula. No
evidence of bowel obstruction.

Vascular/Lymphatic: Atherosclerotic calcifications within the
abdominal aorta and branch vessels. No aneurysm or ectasia. No
enlarged abdominopelvic lymph nodes.

Reproductive: Normal anteverted uterus. Parametrial calcifications
are in often senescent finding. Quiescent appearance of the ovarian
tissue.

Other: Postsurgical changes seen along the lateral left hip with a
large hypoattenuating collection extending to the skin surface
measuring approximately 6.7 x 3 x 4.6 cm in size which could reflect
a postoperative hematoma or seroma. Infection is not fully excluded
though no soft tissue gas is evident.

Musculoskeletal: Post open reduction internal fixation of a
intertrochanteric left femur fracture with placement of an
intramedullary nail and transcervical fixation pin. No acute
hardware fracture or failure is clearly evident accounting for
slight motion artifact and streak. Few displaced fracture fragments
including a retracted fragment comprising the lesser trochanter are
present. No other acute fracture or osseous abnormality of the
abdomen or pelvis. Levocurvature of the lumbar spine apex L3.
Multilevel degenerative changes are present in the imaged portions
of the spine. Additional degenerative changes in the bilateral hips.
IMPRESSION: 1. Long segmental thickening of the colon pericolonic hazy stranding
from the splenic flexure to the distal sigmoid which does not appear
focally centered upon a culprit diverticulum despite the presence of
numerous distal colonic diverticula. Findings are suggestive of a
segmental colitis which could include infectious, inflammatory or
vascular etiologies the latter of which would likely involve the IMA
distribution.
2. Nodular attenuation along the gallbladder wall which could
reflect adherent tumefactive biliary sludge versus discrete
thickening of the gallbladder wall itself. Mild surrounding
pericholecystic inflammation is noted as well. No visible calcified
gallstone. Consider further evaluation with right upper quadrant
ultrasound.
3. Circumferential bladder wall thickening with perivesicular hazy
stranding concerning for cystitis. Additional nonspecific bilateral
perinephric stranding, cannot exclude ascending tract infection.
Correlate with urinalysis.
4. Post open reduction internal fixation of a intertrochanteric left
femur fracture with a large hypoattenuating collection extending to
the skin surface measuring approximately 6.7 x 3 x 4.6 cm in size
which likely reflects a postoperative hematoma or seroma. Infection
is less favored though cannot be fully excluded on an imaging basis.
5. Aortic Atherosclerosis ([7F]-[7F]).

## 2020-06-17 MED ORDER — ONDANSETRON HCL 4 MG/2ML IJ SOLN
4.0000 mg | Freq: Once | INTRAMUSCULAR | Status: AC
  Administered 2020-06-18: 4 mg via INTRAVENOUS
  Filled 2020-06-17: qty 2

## 2020-06-17 MED ORDER — LACTATED RINGERS IV BOLUS
1000.0000 mL | Freq: Once | INTRAVENOUS | Status: AC
  Administered 2020-06-18: 1000 mL via INTRAVENOUS

## 2020-06-17 NOTE — ED Triage Notes (Signed)
Pt in with co diarrhea x 2 states granddaughter saw small amt of blood in tool pt has hx of hemorrhoids. Was recently discharged from [facility] due to fractured hip.  States also vomited x 2, no co abd pain.

## 2020-06-18 ENCOUNTER — Emergency Department: Payer: Medicare HMO

## 2020-06-18 ENCOUNTER — Inpatient Hospital Stay: Payer: Medicare HMO

## 2020-06-18 DIAGNOSIS — K5730 Diverticulosis of large intestine without perforation or abscess without bleeding: Secondary | ICD-10-CM | POA: Diagnosis present

## 2020-06-18 DIAGNOSIS — I959 Hypotension, unspecified: Secondary | ICD-10-CM | POA: Diagnosis present

## 2020-06-18 DIAGNOSIS — Z880 Allergy status to penicillin: Secondary | ICD-10-CM | POA: Diagnosis not present

## 2020-06-18 DIAGNOSIS — E1129 Type 2 diabetes mellitus with other diabetic kidney complication: Secondary | ICD-10-CM | POA: Diagnosis not present

## 2020-06-18 DIAGNOSIS — C23 Malignant neoplasm of gallbladder: Secondary | ICD-10-CM | POA: Diagnosis present

## 2020-06-18 DIAGNOSIS — K529 Noninfective gastroenteritis and colitis, unspecified: Secondary | ICD-10-CM | POA: Diagnosis present

## 2020-06-18 DIAGNOSIS — N179 Acute kidney failure, unspecified: Secondary | ICD-10-CM | POA: Diagnosis present

## 2020-06-18 DIAGNOSIS — Z982 Presence of cerebrospinal fluid drainage device: Secondary | ICD-10-CM | POA: Diagnosis not present

## 2020-06-18 DIAGNOSIS — Z85828 Personal history of other malignant neoplasm of skin: Secondary | ICD-10-CM | POA: Diagnosis not present

## 2020-06-18 DIAGNOSIS — I10 Essential (primary) hypertension: Secondary | ICD-10-CM | POA: Diagnosis not present

## 2020-06-18 DIAGNOSIS — N184 Chronic kidney disease, stage 4 (severe): Secondary | ICD-10-CM | POA: Diagnosis present

## 2020-06-18 DIAGNOSIS — Z79899 Other long term (current) drug therapy: Secondary | ICD-10-CM | POA: Diagnosis not present

## 2020-06-18 DIAGNOSIS — K219 Gastro-esophageal reflux disease without esophagitis: Secondary | ICD-10-CM | POA: Diagnosis not present

## 2020-06-18 DIAGNOSIS — E1122 Type 2 diabetes mellitus with diabetic chronic kidney disease: Secondary | ICD-10-CM | POA: Diagnosis present

## 2020-06-18 DIAGNOSIS — R519 Headache, unspecified: Secondary | ICD-10-CM | POA: Diagnosis not present

## 2020-06-18 DIAGNOSIS — R197 Diarrhea, unspecified: Secondary | ICD-10-CM | POA: Diagnosis not present

## 2020-06-18 DIAGNOSIS — M25562 Pain in left knee: Secondary | ICD-10-CM | POA: Diagnosis not present

## 2020-06-18 DIAGNOSIS — Z96642 Presence of left artificial hip joint: Secondary | ICD-10-CM | POA: Diagnosis present

## 2020-06-18 DIAGNOSIS — R809 Proteinuria, unspecified: Secondary | ICD-10-CM

## 2020-06-18 DIAGNOSIS — Z87891 Personal history of nicotine dependence: Secondary | ICD-10-CM | POA: Diagnosis not present

## 2020-06-18 DIAGNOSIS — Z888 Allergy status to other drugs, medicaments and biological substances status: Secondary | ICD-10-CM | POA: Diagnosis not present

## 2020-06-18 DIAGNOSIS — E785 Hyperlipidemia, unspecified: Secondary | ICD-10-CM | POA: Diagnosis present

## 2020-06-18 DIAGNOSIS — Z20822 Contact with and (suspected) exposure to covid-19: Secondary | ICD-10-CM | POA: Diagnosis present

## 2020-06-18 DIAGNOSIS — R112 Nausea with vomiting, unspecified: Secondary | ICD-10-CM | POA: Diagnosis not present

## 2020-06-18 DIAGNOSIS — I129 Hypertensive chronic kidney disease with stage 1 through stage 4 chronic kidney disease, or unspecified chronic kidney disease: Secondary | ICD-10-CM | POA: Diagnosis present

## 2020-06-18 DIAGNOSIS — K828 Other specified diseases of gallbladder: Secondary | ICD-10-CM | POA: Diagnosis not present

## 2020-06-18 LAB — URINALYSIS, COMPLETE (UACMP) WITH MICROSCOPIC
Bacteria, UA: NONE SEEN
Bilirubin Urine: NEGATIVE
Glucose, UA: NEGATIVE mg/dL
Hgb urine dipstick: NEGATIVE
Ketones, ur: 20 mg/dL — AB
Leukocytes,Ua: NEGATIVE
Nitrite: NEGATIVE
Protein, ur: NEGATIVE mg/dL
Specific Gravity, Urine: 1.019 (ref 1.005–1.030)
pH: 5 (ref 5.0–8.0)

## 2020-06-18 LAB — LACTIC ACID, PLASMA: Lactic Acid, Venous: 1 mmol/L (ref 0.5–1.9)

## 2020-06-18 LAB — RESPIRATORY PANEL BY RT PCR (FLU A&B, COVID)
Influenza A by PCR: NEGATIVE
Influenza B by PCR: NEGATIVE
SARS Coronavirus 2 by RT PCR: NEGATIVE

## 2020-06-18 IMAGING — US US ABDOMEN LIMITED
1 series · 14 of 25 positions shown · non-contrast
Comparison: CT [DATE]

CLINICAL DATA: Emesis and diarrhea, concern for acute cholecystitis
on CT [DATE]

EXAM:
ULTRASOUND ABDOMEN LIMITED RIGHT UPPER QUADRANT

[Series 1: us abdomen limited ruq · 14 of 52 slices shown]
[im 1/52]
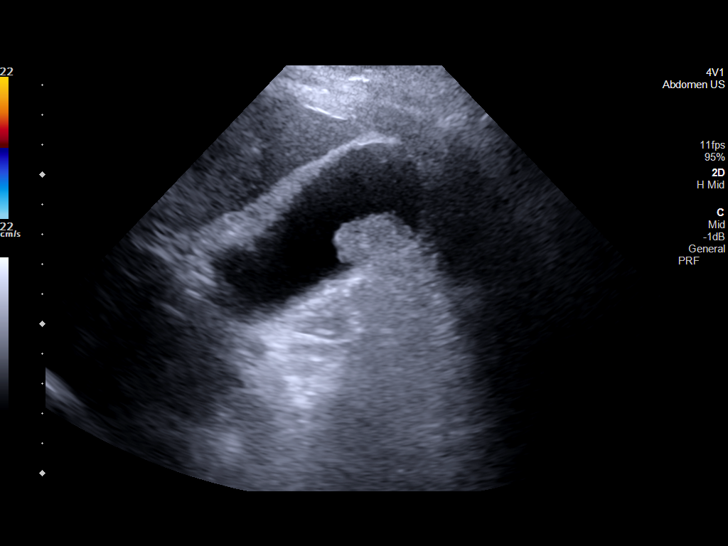
[im 5/52]
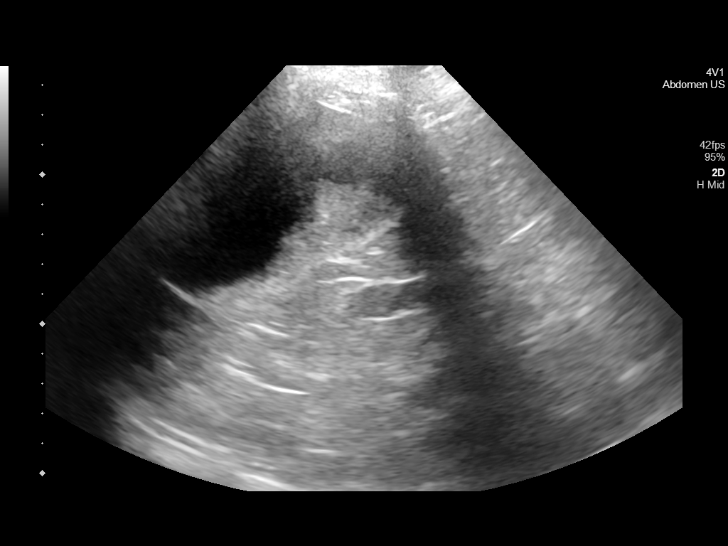
[im 9/52]
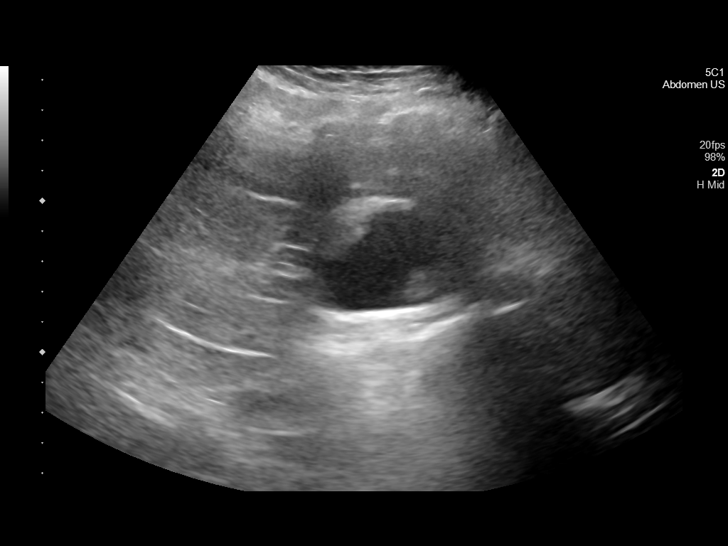
[im 13/52]
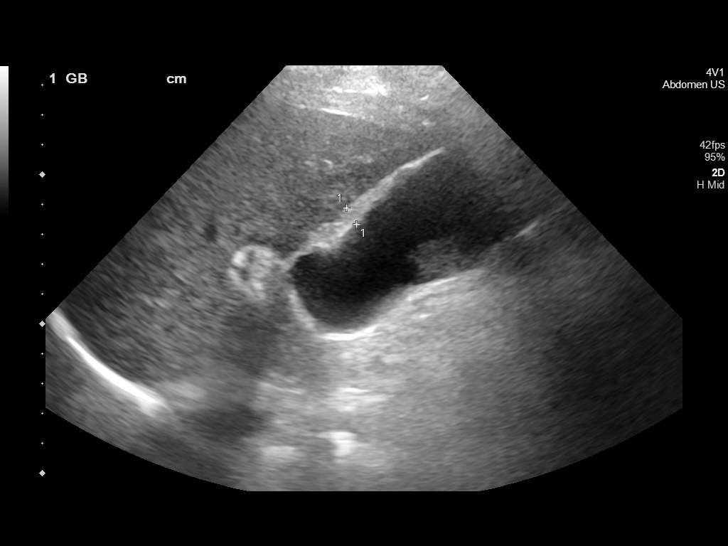
[im 18/52]
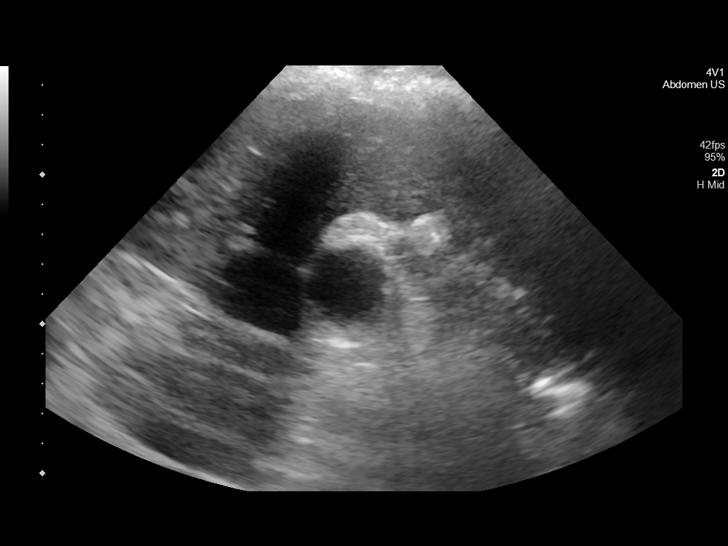
[im 20/52]
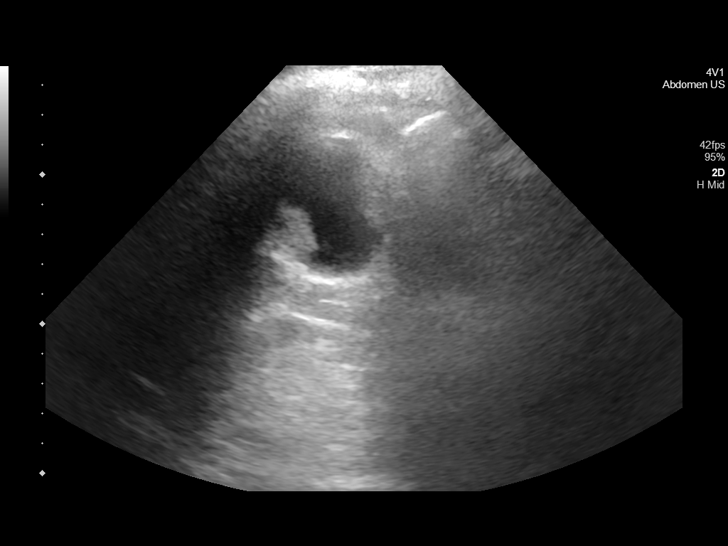
[im 24/52]
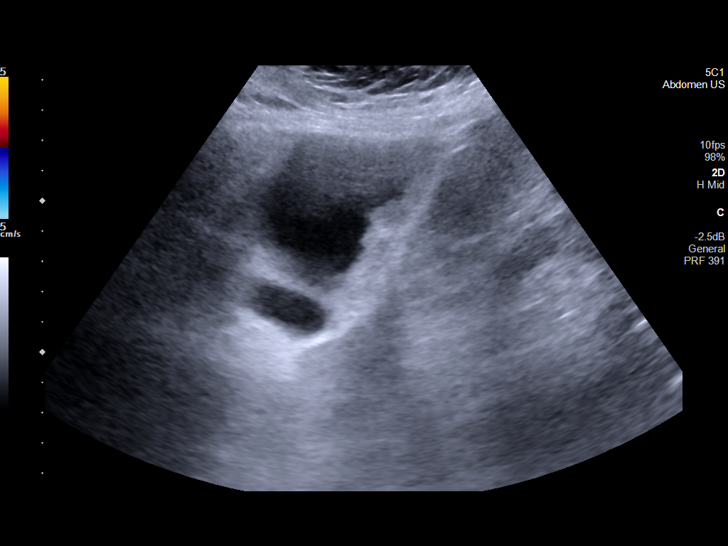
[im 28/52]
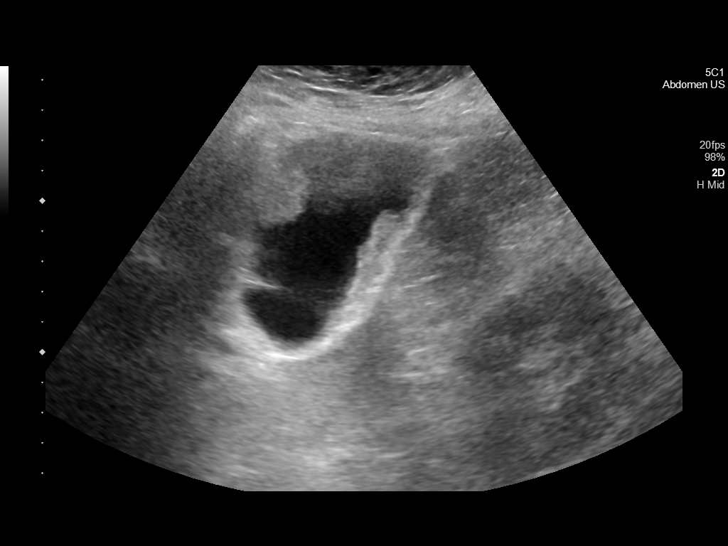
[im 32/52]
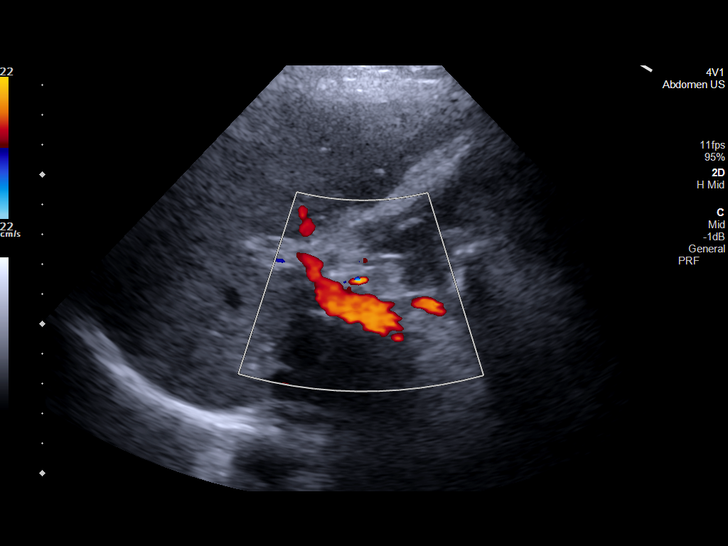
[im 35/52]
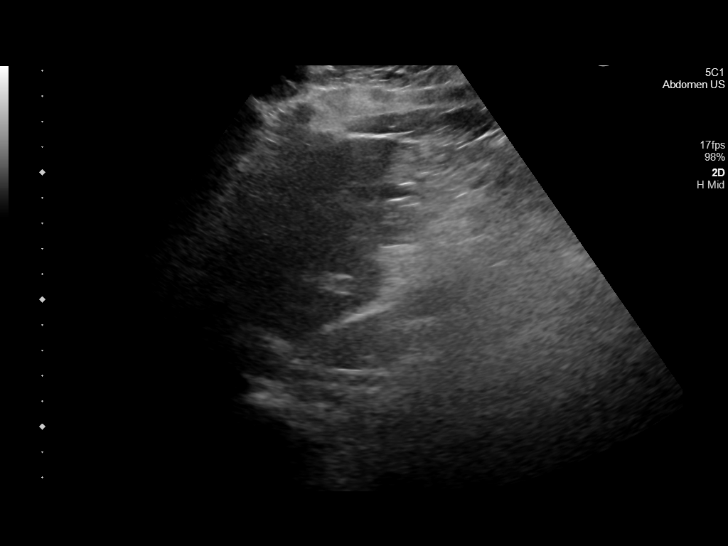
[im 39/52]
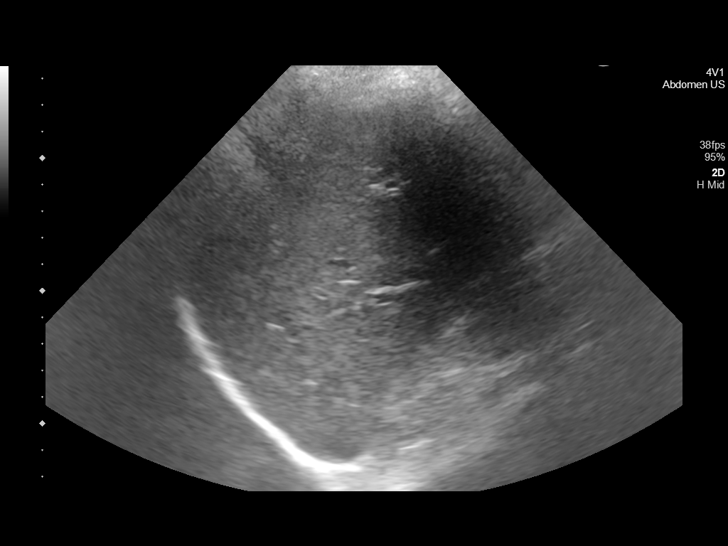
[im 43/52]
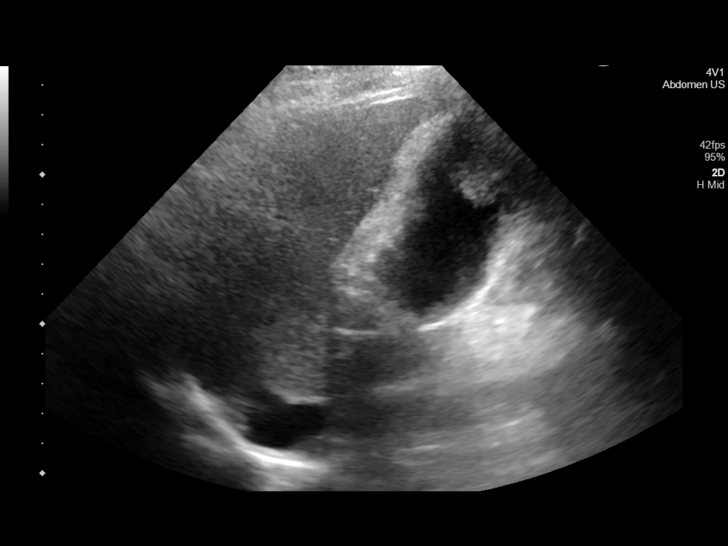
[im 47/52]
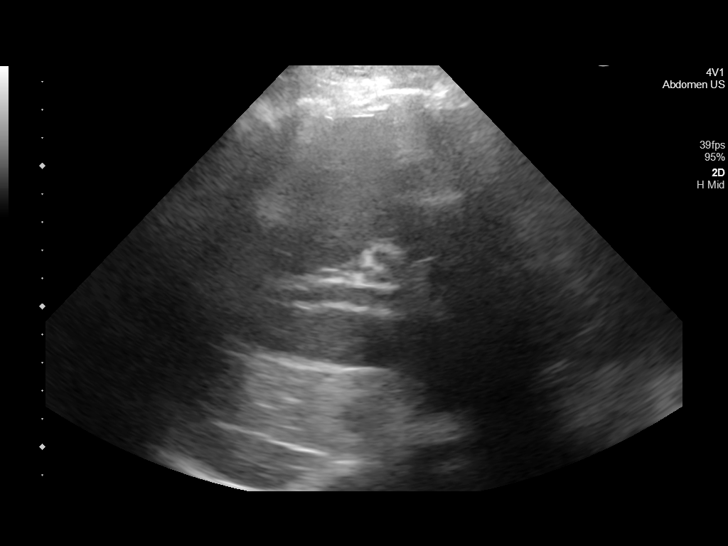
[im 52/52]
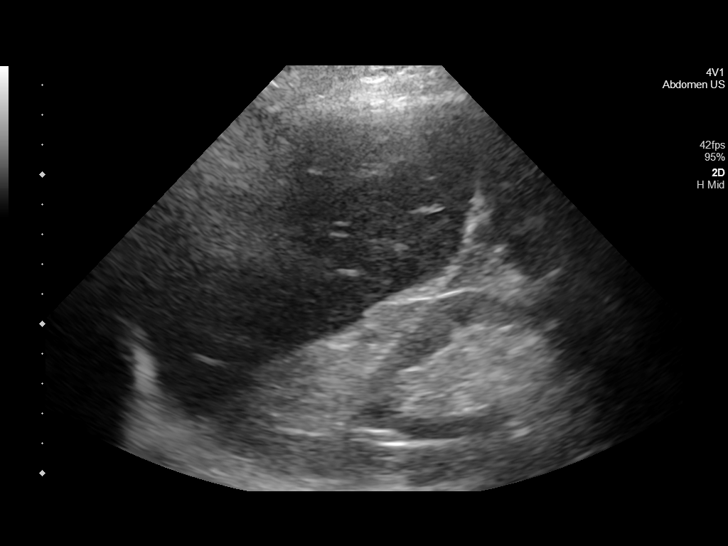

[14 of 25 positions shown; findings below may reference images not displayed]

FINDINGS: Gallbladder:

Non-mobile, mass-like, irregular, hyperechoic lesion along the
gallbladder wall measuring 2.3 x 1.5 cm with no associated vascular
flow. Gallbladder wall thickened measuring up to 6 mm. No
sonographic Murphy sign noted by sonographer.

Common bile duct:

Diameter: 3 mm

Liver:

Scattered calcific densities better appreciated on CT and consistent
with sequelae of prior granulomatous disease. No focal lesion
identified. Within normal limits in parenchymal echogenicity. Portal
vein is patent on color Doppler imaging with normal direction of
blood flow towards the liver.

Other: Increased right renal parenchymal echogenicity. No
hydronephrosis of the right kidney.
IMPRESSION: 1. Findings suggestive of tumefactive sludge. Gallbladder malignancy
remains in the differential. Recommend follow-up ultrasound in 3
months or MRI/surgical consultation earlier if clinically indicated.
2. Increased right renal parenchymal echogenicity suggestive of
chronic renal disease.

## 2020-06-18 IMAGING — MR MR ABDOMEN WO/W CM
19 series · 48 of 48 positions shown · IV contrast (7.5ml Gadavist)
Comparison: [DATE] CT abdomen/pelvis. [DATE] abdominal
sonogram.

CLINICAL DATA: Indeterminate nodular foci along the gallbladder
wall on CT and ultrasound.

EXAM:
MRI ABDOMEN WITHOUT AND WITH CONTRAST
TECHNIQUE: Multiplanar multisequence MR imaging of the abdomen was performed
both before and after the administration of intravenous contrast.
CONTRAST:  7.5mL GADAVIST GADOBUTROL 1 MMOL/ML IV SOLN

[Series 2: T2 · coronal · 6.5mm · 1.19mm/px · 2 of 30 slices shown (1 of 2)]
[im 1/30]
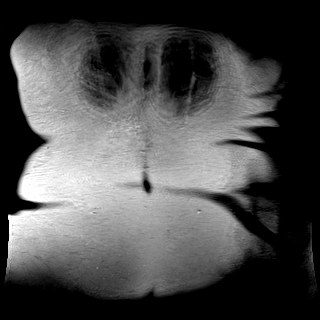
[im 30/30]
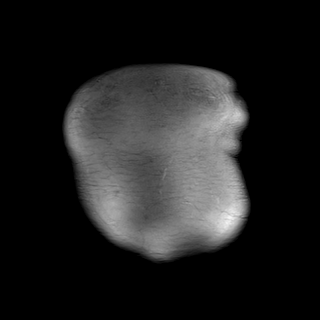

[Series 3: T2 · axial · 6.5mm · 1.19mm/px · z∈[-88,+161]mm · 2 of 33 slices shown (2 of 2)]
[im 1/33]
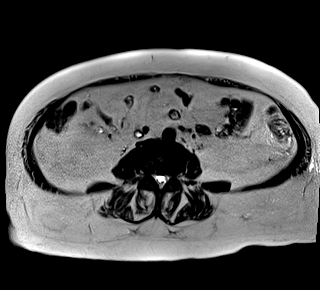
[im 33/33]
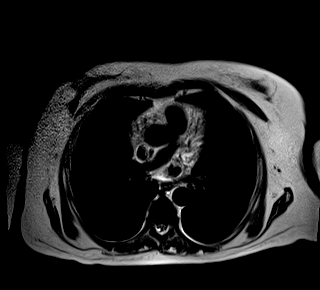

[Series 5: T2 fat-sat · axial · 6.5mm · 1.19mm/px · z∈[-80,+169]mm · 2 of 33 slices shown]
[im 1/33]
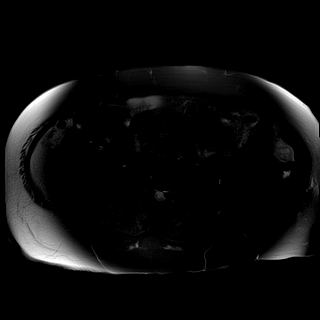
[im 33/33]
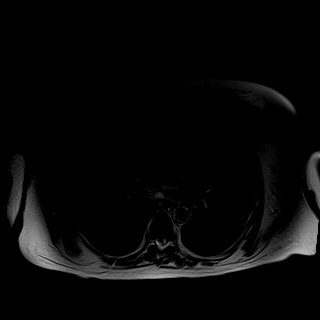

[Series 6: ax dwi_tracew · axial · 6.5mm · 1.42mm/px · z∈[-84,+173]mm · 5 of 102 slices shown]
[im 1/102]
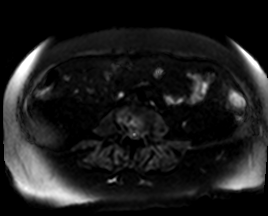
[im 26/102]
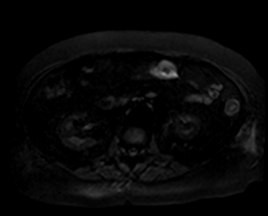
[im 51/102]
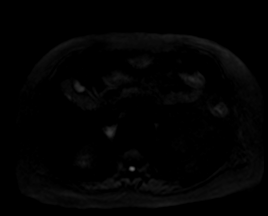
[im 76/102]
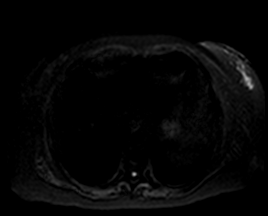
[im 102/102]
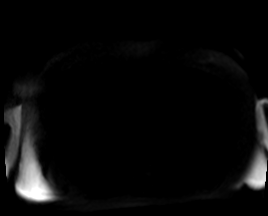

[Series 7: ax dwi_adc · axial · 6.5mm · 1.42mm/px · 1 of 34 slices shown]
[im 1/34]
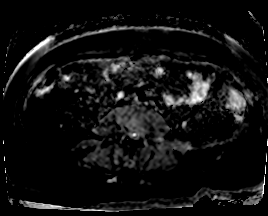

[Series 8: T1 · axial · 6.5mm · 0.74mm/px · 1 of 33 slices shown (1 of 2)]
[im 1/33]
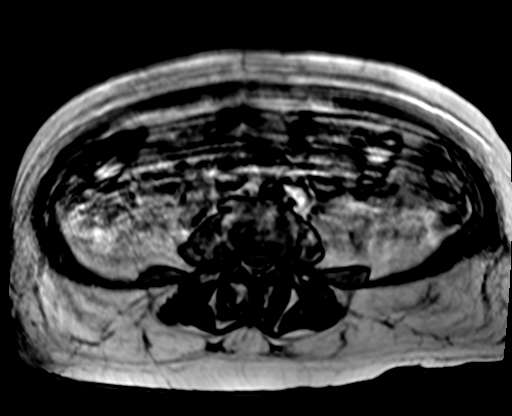

[Series 8: T1 · axial · 6.5mm · 0.74mm/px · 1 of 33 slices shown (2 of 2)]
[im 1/33]
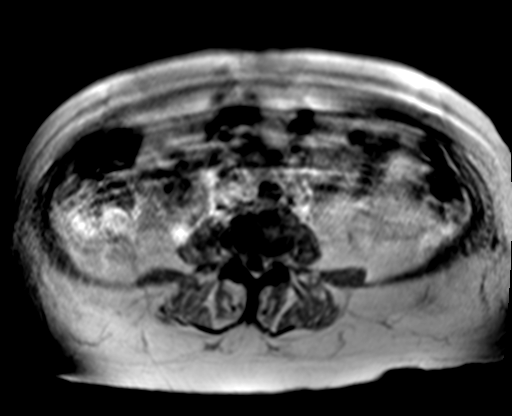

[Series 9: bSSFP · axial · 6.5mm · 0.74mm/px · 1 of 33 slices shown]
[im 1/33]
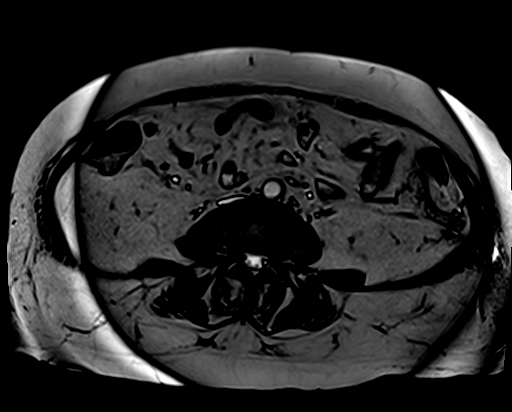

[Series 10: T1 dynamic fat-sat · axial · non-contrast · 3.3mm · 1.19mm/px · z∈[-83,+177]mm · 3 of 80 slices shown (1 of 5)]
[im 1/80]
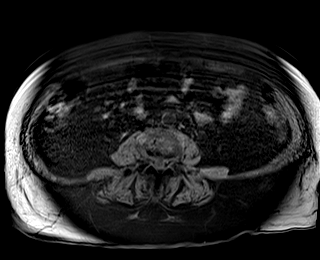
[im 40/80]
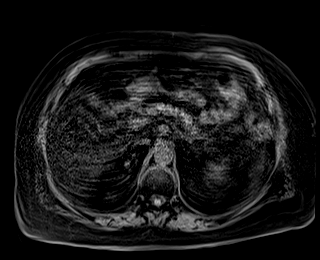
[im 80/80]
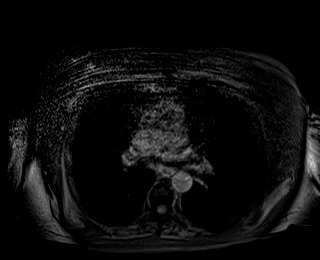

[Series 11: T1 dynamic fat-sat post-contrast · axial · 3.3mm · 1.19mm/px · z∈[-83,+177]mm · 3 of 80 slices shown (1 of 4)]
[im 1/80]
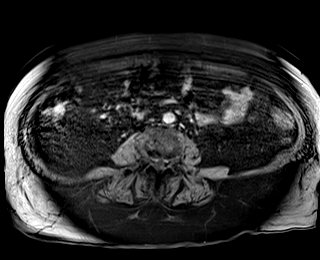
[im 40/80]
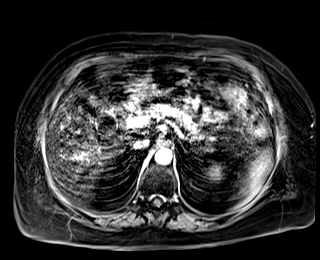
[im 80/80]
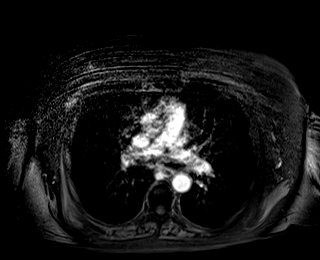

[Series 12: T1 dynamic fat-sat · axial · 3.3mm · 1.19mm/px · z∈[-83,+177]mm · 3 of 80 slices shown (2 of 5)]
[im 1/80]
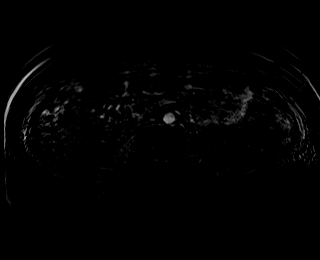
[im 40/80]
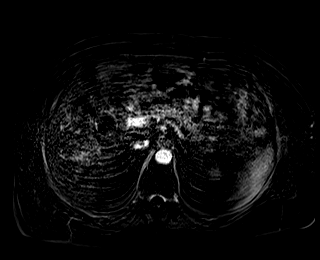
[im 80/80]
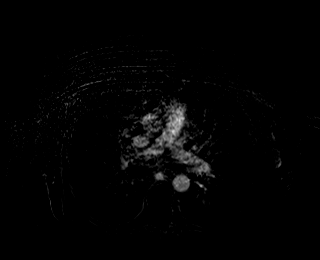

[Series 13: T1 dynamic fat-sat post-contrast · axial · 3.3mm · 1.19mm/px · z∈[-83,+177]mm · 3 of 80 slices shown (2 of 4)]
[im 1/80]
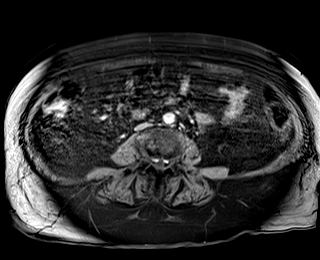
[im 40/80]
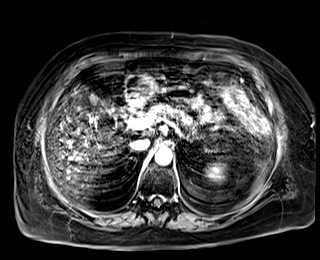
[im 80/80]
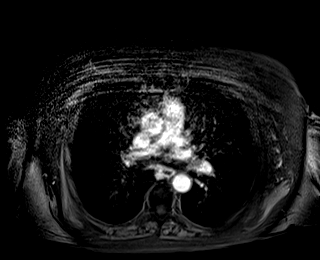

[Series 14: T1 dynamic fat-sat · axial · 3.3mm · 1.19mm/px · z∈[-83,+177]mm · 3 of 80 slices shown (3 of 5)]
[im 1/80]
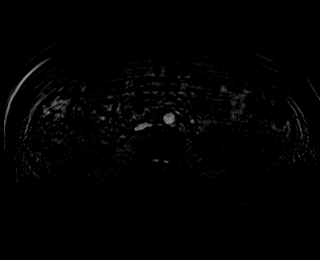
[im 40/80]
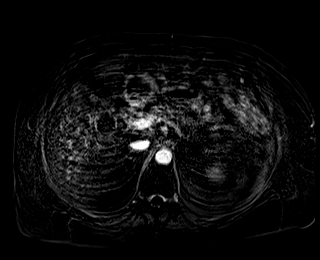
[im 80/80]
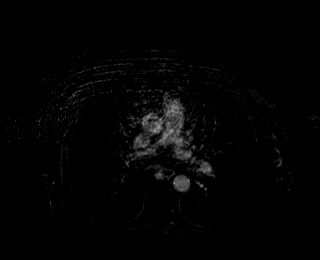

[Series 15: T1 dynamic fat-sat post-contrast · axial · 3.3mm · 1.19mm/px · z∈[-83,+177]mm · 3 of 80 slices shown (3 of 4)]
[im 1/80]
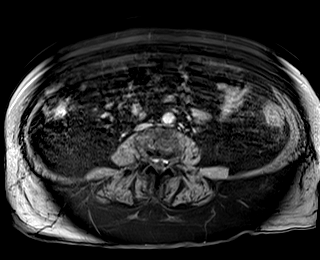
[im 40/80]
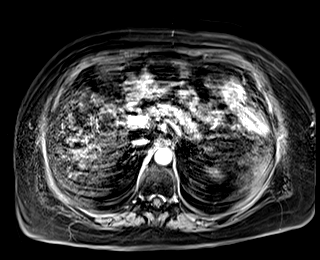
[im 80/80]
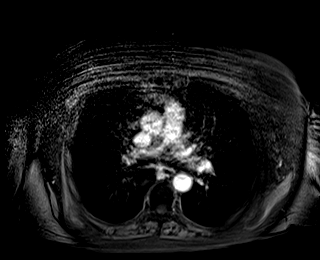

[Series 16: T1 dynamic fat-sat · axial · 3.3mm · 1.19mm/px · z∈[-83,+177]mm · 3 of 80 slices shown (4 of 5)]
[im 1/80]
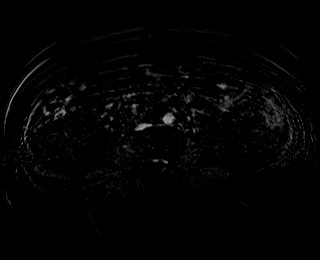
[im 40/80]
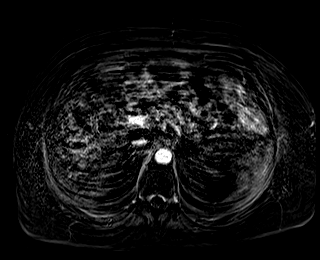
[im 80/80]
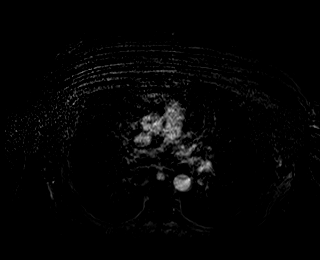

[Series 17: T1 dynamic post-contrast · coronal · 3.0mm · 1.31mm/px · 3 of 72 slices shown (1 of 2)]
[im 1/72]
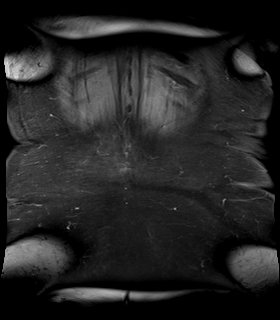
[im 36/72]
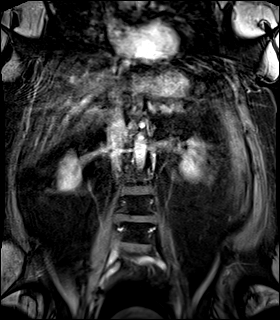
[im 72/72]
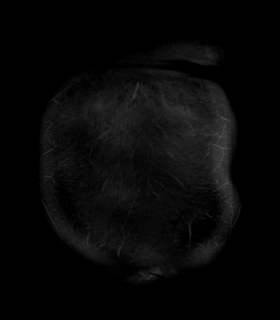

[Series 18: T1 dynamic fat-sat post-contrast · axial · 3.3mm · 1.19mm/px · z∈[-83,+177]mm · 3 of 80 slices shown (4 of 4)]
[im 1/80]
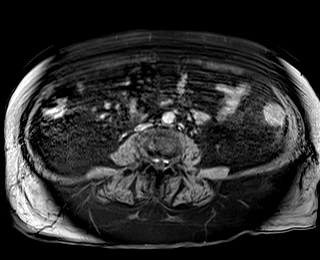
[im 40/80]
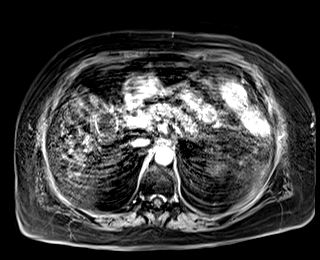
[im 80/80]
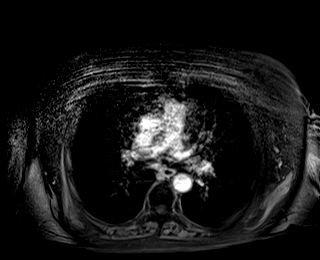

[Series 19: T1 dynamic fat-sat · axial · 3.3mm · 1.19mm/px · z∈[-83,+177]mm · 3 of 80 slices shown (5 of 5)]
[im 1/80]
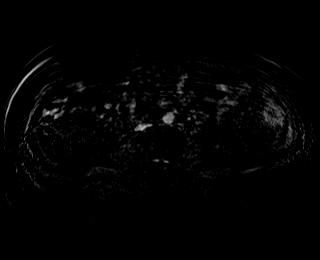
[im 40/80]
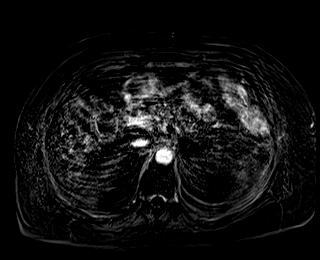
[im 80/80]
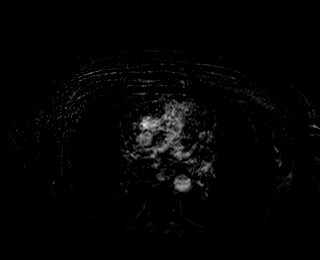

[Series 20: T1 dynamic post-contrast · coronal · 3.0mm · 1.31mm/px · 3 of 72 slices shown (2 of 2)]
[im 1/72]
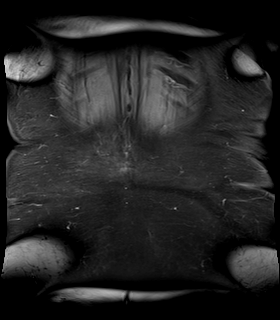
[im 36/72]
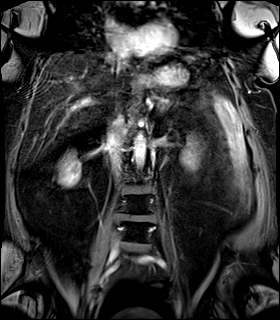
[im 72/72]
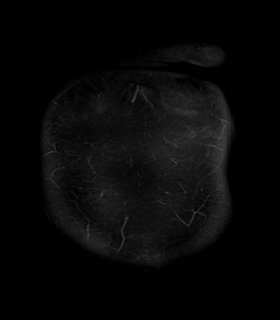

[48 of 48 positions shown; findings below may reference images not displayed]

FINDINGS: Substantially motion degraded scan, limiting assessment.

Lower chest: No acute abnormality at the lung bases.

Hepatobiliary: Normal liver size and configuration. Diffuse hepatic
hemosiderosis. No liver mass. There are multifocal irregular
masslike foci of wall thickening throughout the gallbladder wall
with associated avid enhancement, measuring 4.5 x 1.0 cm posteriorly
(series 3/image 18) and 2.2 x 1.3 cm anteriorly (series 3/image 17).
These gallbladder masses appear confined to the gallbladder with no
appreciable liver invasion. No pericholecystic fluid. No
cholelithiasis. No biliary ductal dilatation. Common bile duct
diameter 5 mm. No choledocholithiasis. No bile duct masses,
strictures or beading.

Pancreas: No pancreatic mass or duct dilation.  No pancreas divisum.

Spleen: Normal size spleen. No splenic mass. Diffuse splenic
hemosiderosis.

Adrenals/Urinary Tract: Normal adrenals. No hydronephrosis. Normal
kidneys with no renal mass.

Stomach/Bowel: Normal non-distended stomach. Visualized small and
large bowel is normal caliber, with no bowel wall thickening.

Vascular/Lymphatic: Atherosclerotic nonaneurysmal abdominal aorta.
Patent portal, splenic, hepatic and renal veins. No pathologically
enlarged lymph nodes in the abdomen.

Other: No abdominal ascites or focal fluid collection.

Musculoskeletal: No aggressive appearing focal osseous lesions.
IMPRESSION: 1. Avidly enhancing multifocal irregular masslike foci of wall
thickening throughout the gallbladder, measuring 4.5 x 1.0 cm
posteriorly and 2.2 x 1.3 cm anteriorly. Findings are suspicious for
gallbladder malignancy. These gallbladder masses appear confined to
the gallbladder with no appreciable liver invasion. Surgical
consultation advised.
2. No biliary ductal dilatation. No cholelithiasis or
choledocholithiasis.
3. Diffuse hepatic and splenic hemosiderosis.

## 2020-06-18 MED ORDER — LISINOPRIL 20 MG PO TABS
40.0000 mg | ORAL_TABLET | Freq: Every day | ORAL | Status: DC
  Administered 2020-06-18 – 2020-07-02 (×15): 40 mg via ORAL
  Filled 2020-06-18 (×14): qty 2
  Filled 2020-06-18: qty 4
  Filled 2020-06-18: qty 2
  Filled 2020-06-18: qty 4

## 2020-06-18 MED ORDER — PANTOPRAZOLE SODIUM 40 MG PO TBEC
40.0000 mg | DELAYED_RELEASE_TABLET | Freq: Every day | ORAL | Status: DC
  Administered 2020-06-18 – 2020-07-04 (×17): 40 mg via ORAL
  Filled 2020-06-18 (×16): qty 1

## 2020-06-18 MED ORDER — AMLODIPINE BESYLATE 5 MG PO TABS
5.0000 mg | ORAL_TABLET | Freq: Every day | ORAL | Status: DC
  Administered 2020-06-18 – 2020-07-02 (×15): 5 mg via ORAL
  Filled 2020-06-18 (×15): qty 1

## 2020-06-18 MED ORDER — GADOBUTROL 1 MMOL/ML IV SOLN
7.5000 mL | Freq: Once | INTRAVENOUS | Status: AC | PRN
  Administered 2020-06-18: 7.5 mL via INTRAVENOUS
  Filled 2020-06-18: qty 7.5

## 2020-06-18 MED ORDER — LORATADINE 10 MG PO TABS
10.0000 mg | ORAL_TABLET | Freq: Every day | ORAL | Status: DC
  Administered 2020-06-18 – 2020-07-04 (×17): 10 mg via ORAL
  Filled 2020-06-18 (×17): qty 1

## 2020-06-18 MED ORDER — SIMVASTATIN 10 MG PO TABS: 40.0000 mg | ORAL_TABLET | Freq: Every day | ORAL | Status: DC

## 2020-06-18 MED ORDER — SODIUM CHLORIDE 0.45 % IV SOLN: INTRAVENOUS | Status: DC

## 2020-06-18 MED ORDER — METRONIDAZOLE IN NACL 5-0.79 MG/ML-% IV SOLN
500.0000 mg | Freq: Once | INTRAVENOUS | Status: AC
  Administered 2020-06-18: 500 mg via INTRAVENOUS
  Filled 2020-06-18: qty 100

## 2020-06-18 MED ORDER — ACETAMINOPHEN 500 MG PO TABS
1000.0000 mg | ORAL_TABLET | Freq: Four times a day (QID) | ORAL | Status: DC | PRN
  Administered 2020-06-18 – 2020-07-04 (×13): 1000 mg via ORAL
  Filled 2020-06-18 (×14): qty 2

## 2020-06-18 MED ORDER — ONDANSETRON HCL 4 MG PO TABS
4.0000 mg | ORAL_TABLET | Freq: Four times a day (QID) | ORAL | Status: DC | PRN
  Administered 2020-06-23 – 2020-07-03 (×2): 4 mg via ORAL
  Filled 2020-06-18 (×2): qty 1

## 2020-06-18 MED ORDER — ONDANSETRON HCL 4 MG/2ML IJ SOLN
4.0000 mg | Freq: Four times a day (QID) | INTRAMUSCULAR | Status: DC | PRN
  Filled 2020-06-18: qty 2

## 2020-06-18 MED ORDER — HYDROCHLOROTHIAZIDE 25 MG PO TABS
25.0000 mg | ORAL_TABLET | Freq: Every day | ORAL | Status: DC
  Administered 2020-06-18 – 2020-06-20 (×3): 25 mg via ORAL
  Filled 2020-06-18 (×4): qty 1

## 2020-06-18 MED ORDER — SODIUM CHLORIDE 0.9 % IV SOLN
1.0000 g | Freq: Once | INTRAVENOUS | Status: AC
  Administered 2020-06-18: 1 g via INTRAVENOUS
  Filled 2020-06-18: qty 10

## 2020-06-18 MED ORDER — VITAMIN D 25 MCG (1000 UNIT) PO TABS
1000.0000 [IU] | ORAL_TABLET | Freq: Every day | ORAL | Status: DC
  Administered 2020-06-18 – 2020-07-04 (×17): 1000 [IU] via ORAL
  Filled 2020-06-18 (×17): qty 1

## 2020-06-18 MED ORDER — ATORVASTATIN CALCIUM 20 MG PO TABS
20.0000 mg | ORAL_TABLET | Freq: Every day | ORAL | Status: DC
  Administered 2020-06-18 – 2020-07-03 (×16): 20 mg via ORAL
  Filled 2020-06-18 (×16): qty 1

## 2020-06-18 MED ORDER — SODIUM BICARBONATE 650 MG PO TABS
650.0000 mg | ORAL_TABLET | Freq: Two times a day (BID) | ORAL | Status: DC
  Administered 2020-06-18 – 2020-07-04 (×32): 650 mg via ORAL
  Filled 2020-06-18 (×35): qty 1

## 2020-06-18 NOTE — Progress Notes (Signed)
PHARMACIST - PHYSICIAN ORDER COMMUNICATION   CONCERNING: Simvastatin 40 mg daily and amlodipine - rhabdomyolysis risk      DESCRIPTION:   Patients on amlodipine and simvastatin >20 mg/day have reported cases of rhabdomyolysis. Pharmacy is to assess simvastatin dose. If >20 mg, substitute atorvastatin (Lipitor) 1mg for each 2mg simvastatin.     This patient is ordered simvastatin 40mg and amlodipine 5mg.                                                               ACTION TAKEN: Per protocol pharmacy has discontinued the patient's order for simvastatin and replaced it with Atorvastatin 20 mg.   

## 2020-06-18 NOTE — ED Notes (Signed)
Pt resting comfortably at this time. Pt given daily medications. Pt denies any further needs at this time. Call bell in reach. VSS. Pt updated on plan of care

## 2020-06-18 NOTE — ED Notes (Signed)
No change in condition.

## 2020-06-18 NOTE — ED Provider Notes (Signed)
[facility] Emergency Department Provider Note  ____________________________________________  Time seen: Approximately 12:59 AM  I have reviewed the triage vital signs and the nursing notes.   HISTORY  Chief Complaint Diarrhea   HPI Michelle Guerrero is a 79 y.o. female with a history of hypertension, chronic kidney disease, hyperlipidemia, diabetes who presents for evaluation of vomiting and diarrhea.  Symptoms started this evening.  Patient reports 2 episodes of nonbloody nonbilious emesis and 2 episodes of watery diarrhea.  Patient recently moved back home from rehab yesterday after being there for a hip fracture.  She denies any pain in her hip and has been able to move it without any difficulties.  She reports that her recovery has been uneventful.  She denies fever chills, cough or congestion, abdominal pain, dysuria or hematuria.   Patient reports history of hemorrhoids and her granddaughter saw small amount of blood in her stool earlier today.  No melena, no hematochezia, no coffee-ground emesis, no hematemesis.  No prior history of C. difficile  Past Medical History:  Diagnosis Date  . Basal cell carcinoma 12/21/2019   left ant shoulder  . 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 Acute colitis 06/18/2020  . History of skin cancer 04/02/2020  . Hypertension 04/02/2020  . DNR (do not resuscitate) discussion 10/27/2018  . Hyperkeratosis of nail 09/20/2018  . Open wound of left upper arm 05/17/2018  . CKD (chronic kidney disease) stage 3, GFR 30-59 ml/min (HCC) 08/10/2017  . Poor dentition 08/04/2016  . 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 Pure hypercholesterolemia 05/07/2015    No past surgical history on file.  Prior to Admission medications   Medication Sig Start Date End Date Taking? Authorizing Provider    amLODipine (NORVASC) 5 MG tablet Take 5 mg by mouth daily. 12/05/19   [provider]  doxycycline (ADOXA) 100 MG tablet Take 1 tablet (100 mg total) by mouth 2 (two) times daily. Take with food 04/08/20   Brendolyn Patty, MD  hydrochlorothiazide (HYDRODIURIL) 25 MG tablet Take 25 mg by mouth daily. 01/01/20   [provider]  lisinopril (ZESTRIL) 40 MG tablet Take 1 tablet by mouth daily. 01/02/20   [provider]  mupirocin ointment (BACTROBAN) 2 % Apply to affected areas of wounds QD after cleaning. 04/08/20   Brendolyn Patty, MD  simvastatin (ZOCOR) 40 MG tablet Take 40 mg by mouth at bedtime. 01/01/20   [provider]  sodium bicarbonate 650 MG tablet Take 650 mg by mouth 2 (two) times daily. 01/03/20   [provider]    Allergies Penicillins and Montelukast  No family history on file.  Social History Social History   Tobacco Use  . Smoking status: Former Research scientist (life sciences)  . Smokeless tobacco: Never Used  . Tobacco comment: 20 years ago  Substance Use Topics  . Alcohol use: Not Currently  . Drug use: Not Currently    Review of Systems  Constitutional: Negative for fever. Eyes: Negative for visual changes. ENT: Negative for sore throat. Neck: No neck pain  Cardiovascular: Negative for chest pain. Respiratory: Negative for shortness of breath. Gastrointestinal: Negative for abdominal pain. + vomiting and diarrhea. Genitourinary: Negative for dysuria. Musculoskeletal: Negative for back pain. Skin: Negative for rash. Neurological: Negative for headaches, weakness or numbness. Psych: No SI or HI  ____________________________________________   PHYSICAL EXAM:  VITAL SIGNS: ED Triage Vitals  Enc Vitals Group     BP 06/17/20 2152 (!) 105/59     Pulse Rate 06/17/20 2152 (!) 101     Resp 06/17/20 2152 18     Temp 06/17/20 2152 97.8 F (36.6 C)     Temp Source 06/17/20 2152 Oral     SpO2 06/17/20 2152 100 %     Weight 06/17/20 2153 181 lb 7  oz (82.3 kg)     Height 06/17/20 2153 5\' 2"  (1.575 m)     Head Circumference --      Peak Flow --      Pain Score 06/17/20 2151 0     Pain Loc --      Pain Edu? --      Excl. in [place]? --     Constitutional: Alert and oriented. Well appearing and in no apparent distress. HEENT:      Head: Normocephalic and atraumatic.         Eyes: Conjunctivae are normal. Sclera is non-icteric.       Mouth/Throat: Mucous membranes are moist.       Neck: Supple with no signs of meningismus. Cardiovascular: Regular rate and rhythm. No murmurs, gallops, or rubs.  Respiratory: Normal respiratory effort. Lungs are clear to auscultation bilaterally.  Gastrointestinal: Soft, non tender, and non distended with positive bowel sounds. No rebound or guarding. Musculoskeletal: Well-healing left hip surgical scar with no tenderness palpation, no erythema, no swelling, no warmth. No edema, cyanosis, or erythema of extremities. Neurologic: Normal speech and language. Face is symmetric. Moving all extremities. No gross focal neurologic deficits are appreciated. Skin: Skin is warm, dry and intact. No rash noted. Psychiatric: Mood and affect are normal. Speech and behavior are normal.  ____________________________________________   LABS (all labs ordered are listed, but only abnormal results are displayed)  Labs Reviewed  CBC - Abnormal; Notable for the following components:      Result Value   WBC 23.3 (*)    RDW 15.6 (*)    Platelets 528 (*)    All other components within normal limits  COMPREHENSIVE METABOLIC PANEL - Abnormal; Notable for the following components:   CO2 20 (*)    Glucose, Bld 100 (*)    BUN 40 (*)    Creatinine, Ser 1.83 (*)    Total Protein 8.3 (*)    GFR, Estimated 26 (*)    All other components within normal limits  URINALYSIS, COMPLETE (UACMP) WITH MICROSCOPIC - Abnormal; Notable for the following components:   Color, Urine YELLOW (*)    APPearance HAZY (*)    Ketones, ur 20 (*)     All other components within normal limits  RESPIRATORY PANEL BY RT PCR (FLU A&B, COVID)  C DIFFICILE QUICK SCREEN W PCR REFLEX  LACTIC ACID, PLASMA   ____________________________________________  EKG  none  ____________________________________________  RADIOLOGY  I have personally reviewed the images performed during this visit and I agree with the Radiologist's read.   Interpretation by Radiologist:  CT ABDOMEN PELVIS WO CONTRAST  Result Date: 06/18/2020 CLINICAL DATA:  Emesis and diarrhea, recent hip fracture EXAM: CT ABDOMEN AND PELVIS WITHOUT CONTRAST TECHNIQUE: Multidetector CT imaging of the abdomen and pelvis was performed following the standard protocol without IV contrast. COMPARISON:  None. FINDINGS: Lower chest: Motion degraded imaging of the lung bases. Some dependent atelectasis posteriorly. Normal heart size. No pericardial effusion. Coronary artery calcifications are present. Hepatobiliary: Few punctate calcifications within the liver may reflect  sequela of prior granulomatous disease. No focal concerning liver lesion on this unenhanced CT. Smooth liver surface contour. Normal liver attenuation. There is nodular attenuation along the gallbladder wall which could reflect adherent biliary sludge or discrete thickening of the gallbladder wall itself. Mild surrounding pericholecystic inflammation is noted as well. No visible calcified gallstone. No biliary ductal dilatation. Pancreas: Partial fatty replacement of the pancreas. No pancreatic ductal dilatation or surrounding inflammatory changes. Spleen: Normal in size. No concerning splenic lesions. Adrenals/Urinary Tract: Normal adrenal glands. No visible concerning renal lesion. Moderate bilateral perinephric stranding is nonspecific. No urolithiasis or hydronephrosis. Urinary bladder is largely decompressed at the time of exam and therefore poorly evaluated by CT imaging. Circumferential bladder wall thickening is noted which is  nonspecific given underdistention though with perivesicular hazy stranding does raise concern for possible cystitis. Stomach/Bowel: Distal esophagus and stomach are unremarkable. No small bowel thickening or dilatation. The appendix is surgically absent. Long segmental thickening of the colon pericolonic hazy stranding is seen from the mid transverse to the distal sigmoid which does not appear focally centered upon a culprit diverticulum despite the presence of numerous distal colonic diverticula. No evidence of bowel obstruction. Vascular/Lymphatic: Atherosclerotic calcifications within the abdominal aorta and branch vessels. No aneurysm or ectasia. No enlarged abdominopelvic lymph nodes. Reproductive: Normal anteverted uterus. Parametrial calcifications are in often senescent finding. Quiescent appearance of the ovarian tissue. Other: Postsurgical changes seen along the lateral left hip with a large hypoattenuating collection extending to the skin surface measuring approximately 6.7 x 3 x 4.6 cm in size which could reflect a postoperative hematoma or seroma. Infection is not fully excluded though no soft tissue gas is evident. Musculoskeletal: Post open reduction internal fixation of a intertrochanteric left femur fracture with placement of an intramedullary nail and transcervical fixation pin. No acute hardware fracture or failure is clearly evident accounting for slight motion artifact and streak. Few displaced fracture fragments including a retracted fragment comprising the lesser trochanter are present. No other acute fracture or osseous abnormality of the abdomen or pelvis. Levocurvature of the lumbar spine apex L3. Multilevel degenerative changes are present in the imaged portions of the spine. Additional degenerative changes in the bilateral hips. IMPRESSION: 1. Long segmental thickening of the colon pericolonic hazy stranding from the splenic flexure to the distal sigmoid which does not appear focally  centered upon a culprit diverticulum despite the presence of numerous distal colonic diverticula. Findings are suggestive of a segmental colitis which could include infectious, inflammatory or vascular etiologies the latter of which would likely involve the IMA distribution. 2. Nodular attenuation along the gallbladder wall which could reflect adherent tumefactive biliary sludge versus discrete thickening of the gallbladder wall itself. Mild surrounding pericholecystic inflammation is noted as well. No visible calcified gallstone. Consider further evaluation with right upper quadrant ultrasound. 3. Circumferential bladder wall thickening with perivesicular hazy stranding concerning for cystitis. Additional nonspecific bilateral perinephric stranding, cannot exclude ascending tract infection. Correlate with urinalysis. 4. Post open reduction internal fixation of a intertrochanteric left femur fracture with a large hypoattenuating collection extending to the skin surface measuring approximately 6.7 x 3 x 4.6 cm in size which likely reflects a postoperative hematoma or seroma. Infection is less favored though cannot be fully excluded on an imaging basis. 5. Aortic Atherosclerosis (ICD10-I70.0). Electronically Signed   By: Lovena Le M.D.   On: 06/18/2020 00:07   US ABDOMEN LIMITED RUQ  Result Date: 06/18/2020 CLINICAL DATA:  Emesis and diarrhea, concern for acute cholecystitis on CT  06/17/2020 EXAM: ULTRASOUND ABDOMEN LIMITED RIGHT UPPER QUADRANT COMPARISON:  CT 06/17/2020 FINDINGS: Gallbladder: Non-mobile, mass-like, irregular, hyperechoic lesion along the gallbladder wall measuring 2.3 x 1.5 cm with no associated vascular flow. Gallbladder wall thickened measuring up to 6 mm. No sonographic Murphy sign noted by sonographer. Common bile duct: Diameter: 3 mm Liver: Scattered calcific densities better appreciated on CT and consistent with sequelae of prior granulomatous disease. No focal lesion identified. Within  normal limits in parenchymal echogenicity. Portal vein is patent on color Doppler imaging with normal direction of blood flow towards the liver. Other: Increased right renal parenchymal echogenicity. No hydronephrosis of the right kidney. IMPRESSION: 1. Findings suggestive of tumefactive sludge. Gallbladder malignancy remains in the differential. Recommend follow-up ultrasound in 3 months or MRI/surgical consultation earlier if clinically indicated. 2. Increased right renal parenchymal echogenicity suggestive of chronic renal disease. Electronically Signed   By: Iven Finn M.D.   On: 06/18/2020 04:01     ____________________________________________   PROCEDURES  Procedure(s) performed:yes .1-3 Lead EKG Interpretation Performed by: Rudene Re, MD Authorized by: Rudene Re, MD     Interpretation: non-specific     ECG rate assessment: tachycardic     Rhythm: sinus tachycardia     Ectopy: none     Critical Care performed: yes  CRITICAL CARE Performed by: Rudene Re  ?  Total critical care time: 45 min  Critical care time was exclusive of separately billable procedures and treating other patients.  Critical care was necessary to treat or prevent imminent or life-threatening deterioration.  Critical care was time spent personally by me on the following activities: development of treatment plan with patient and/or surrogate as well as nursing, discussions with consultants, evaluation of patient's response to treatment, examination of patient, obtaining history from patient or surrogate, ordering and performing treatments and interventions, ordering and review of laboratory studies, ordering and review of radiographic studies, pulse oximetry and re-evaluation of patient's condition.  ____________________________________________   INITIAL IMPRESSION / ASSESSMENT AND PLAN / ED COURSE   79 y.o. female with a history of hypertension, chronic kidney disease,  hyperlipidemia, diabetes who presents for evaluation of vomiting and diarrhea.  Patient is afebrile, slightly tachycardic with a pulse of 101 and slightly hypotensive with BP of 105/59.  Her abdomen is soft and nontender throughout.  Differential diagnosis including colitis versus C. difficile versus Covid versus gastroenteritis.  CT visualized by me showing signs of colitis and possible pyelonephritis.  Her gallbladder also looks abnormal on CT.  Read confirmed by radiology.  Right upper quadrant ultrasound pending.  Unfortunately she is allergic to penicillin therefore will cover with Rocephin and Flagyl.  Labs showing leukocytosis with white count of 23 and thrombocytosis with platelets of 528.  Mild acute on chronic kidney injury with creatinine of 1.8 (baseline is 1.4).  UA is pending to rule out UTI.  C. difficile is pending.  Will give IV fluids and Zofran for her symptoms.  Old medical records reviewed.  Anticipate admission.  Patient placed on telemetry for close monitoring.  _________________________ 4:37 AM on 06/18/2020 -----------------------------------------  No stool yet. UA pending. [place] concerning for GB malignancy. Will admit to Hospitalist    _____________________________________________ Please note:  Patient was evaluated in Emergency Department today for the symptoms described in the history of present illness. Patient was evaluated in the context of the global COVID-19 pandemic, which necessitated consideration that the patient might be at risk for infection with the SARS-CoV-2 virus that causes COVID-19. Institutional protocols and algorithms  that pertain to the evaluation of patients at risk for COVID-19 are in a state of rapid change based on information released by regulatory bodies including the CDC and federal and state organizations. These policies and algorithms were followed during the patient's care in the ED.  Some ED evaluations and interventions may be delayed as a  result of limited staffing during the pandemic.   [place] Controlled Substance Database was reviewed by me. ____________________________________________   FINAL CLINICAL IMPRESSION(S) / ED DIAGNOSES   Final diagnoses:  Abnormal CT scan  Colitis  Acute renal failure superimposed on chronic kidney disease, unspecified CKD stage, unspecified acute renal failure type ([place])  Gallbladder mass      NEW MEDICATIONS STARTED DURING THIS VISIT:  ED Discharge Orders    None       Note:  This document was prepared using Dragon voice recognition software and may include unintentional dictation errors.    Rudene Re, MD 06/18/20 [phone]

## 2020-06-18 NOTE — ED Notes (Signed)
Patient returned from MRI.

## 2020-06-18 NOTE — ED Notes (Signed)
Pt changed by this RN at this time. New chux placed under patient at this time, new brief applied. Pt had small bowel movement that consisted of dark stool. Not enough obtained for sample. Pt placed on purwick at this time, new clean/dry/warm blankets given to patient. No further needs noted. Will continue to monitor.

## 2020-06-18 NOTE — ED Notes (Signed)
This nurse attempted iv without success. Unable to use pt left arm so limited access available.

## 2020-06-18 NOTE — Consult Note (Addendum)
Consultation  Referring Provider:     Dr Francine Graven Admit date10/12/21 Consult date     06/18/20    Reason for Consultation:     colitis         HPI:   Michelle Guerrero is a 79 y.o. 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 Patient has had recent hospitalization for ORIF relating to hip fracture after a fall in [place], and rehab stay- now living with her daughter in [place]. Patient reports she developed nausea, vomiting, and diarrhea after eating soup yesterday. Denies any abdominal pain. Felt hot and cold alternating at time of symptom onset. She is unsure of what meds she is taking and does not know if she has received any recent antibiotics- however in chart review a course of doxycycline was prescribed in august. She denies any further GI related concerns. At this time the nausea and vomiting has improved; she denies any abdominal pain. She did have an episode of very dark brown diarrhea with some red traces (incontinent). Cdiff test is pending collection.  Labs on admisstion: wbc 23, hgb 12, platelets 528. Liver enzymes and albumin normal.  BUN 40/creatinine 1.83. K+ normal. Lactic acid normal. She was given a dose of rocephin and flagyl since arrival.  Has had CT this admission:  IMPRESSION: 1. Long segmental thickening of the colon pericolonic hazy stranding from the splenic flexure to the distal sigmoid which does not appear focally centered upon a culprit diverticulum despite the presence of numerous distal colonic diverticula. Findings are suggestive of a segmental colitis which could include infectious, inflammatory or vascular etiologies the latter of which would likely involve the IMA distribution. 2. Nodular attenuation along the gallbladder wall which could reflect adherent tumefactive biliary sludge versus discrete thickening of the gallbladder wall itself. Mild surrounding pericholecystic  inflammation is noted as well. No visible calcified gallstone. Consider further evaluation with right upper quadrant ultrasound. 3. Circumferential bladder wall thickening with perivesicular hazy stranding concerning for cystitis. Additional nonspecific bilateral perinephric stranding, cannot exclude ascending tract infection. Correlate with urinalysis. 4. Post open reduction internal fixation of a intertrochanteric left femur fracture with a large hypoattenuating collection extending to the skin surface measuring approximately 6.7 x 3 x 4.6 cm in size which likely reflects a postoperative hematoma or seroma. Infection is less favored though cannot be fully excluded on an imaging basis. 5. Aortic Atherosclerosis (ICD10-I70.0).  RUQ [place]: IMPRESSION: 1. Findings suggestive of tumefactive sludge. Gallbladder malignancy remains in the differential. Recommend follow-up ultrasound in 3 months or MRI/surgical consultation earlier if clinically indicated. 2. Increased right renal parenchymal echogenicity suggestive of chronic renal disease  PREVIOUS ENDOSCOPIES:            None known. Had negative fob 2017 at PCP at that time.  Past Medical History:  Diagnosis Date  . Basal cell carcinoma 12/21/2019   left ant shoulder  . 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 ORIF 2021  No family history on file.   Social History   Tobacco Use  . Smoking status: Former Research scientist (life sciences)  . Smokeless tobacco: Never Used  . Tobacco comment: 20 years ago  Substance Use Topics  . Alcohol use: Not Currently  . Drug use: Not Currently    Prior to Admission medications   Medication Sig Start Date End Date Taking? Authorizing Provider  acetaminophen (  TYLENOL) 500 MG tablet Take 1,000 mg by mouth in the morning and at bedtime.   Yes [provider]  amLODipine (NORVASC) 5 MG tablet Take 5 mg by mouth  daily. 12/05/19  Yes [provider]  aspirin 325 MG tablet Take 325 mg by mouth in the morning and at bedtime.   Yes [provider]  cetirizine (ZYRTEC) 10 MG tablet Take 10 mg by mouth daily.   Yes [provider]  cholecalciferol (VITAMIN D3) 25 MCG (1000 UNIT) tablet Take 1,000 Units by mouth daily.   Yes [provider]  hydrochlorothiazide (HYDRODIURIL) 25 MG tablet Take 25 mg by mouth daily. 01/01/20  Yes [provider]  lisinopril (ZESTRIL) 40 MG tablet Take 1 tablet by mouth daily. 01/02/20  Yes [provider]  mupirocin ointment (BACTROBAN) 2 % Apply to affected areas of wounds QD after cleaning. 04/08/20  Yes Brendolyn Patty, MD  omeprazole-sodium bicarbonate (ZEGERID) 40-1100 MG capsule Take 1 capsule by mouth daily before breakfast.   Yes [provider]  simvastatin (ZOCOR) 40 MG tablet Take 40 mg by mouth at bedtime. 01/01/20  Yes [provider]  sodium bicarbonate 650 MG tablet Take 650 mg by mouth 2 (two) times daily. 01/03/20  Yes [provider]  doxycycline (ADOXA) 100 MG tablet Take 1 tablet (100 mg total) by mouth 2 (two) times daily. Take with food Patient not taking: Reported on 06/18/2020 04/08/20   Brendolyn Patty, MD    Current Facility-Administered Medications  Medication Dose Route Frequency Provider Last Rate Last Admin  . 0.45 % sodium chloride infusion   Intravenous Continuous Agbata, Tochukwu, MD 100 mL/hr at 06/18/20 1214 New Bag at 06/18/20 1214  . acetaminophen (TYLENOL) tablet 1,000 mg  1,000 mg Oral Q6H PRN Agbata, Tochukwu, MD   1,000 mg at 06/18/20 1217  . amLODipine (NORVASC) tablet 5 mg  5 mg Oral Daily Agbata, Tochukwu, MD   5 mg at 06/18/20 1209  . cholecalciferol (VITAMIN D3) tablet 1,000 Units  1,000 Units Oral Daily Agbata, Tochukwu, MD   1,000 Units at 06/18/20 1210  . hydrochlorothiazide (HYDRODIURIL) tablet 25 mg  25 mg Oral Daily Agbata, Tochukwu, MD   25 mg at 06/18/20 1217   . lisinopril (ZESTRIL) tablet 40 mg  40 mg Oral Daily Agbata, Tochukwu, MD   40 mg at 06/18/20 1443  . loratadine (CLARITIN) tablet 10 mg  10 mg Oral Daily Agbata, Tochukwu, MD   10 mg at 06/18/20 1209  . ondansetron (ZOFRAN) tablet 4 mg  4 mg Oral Q6H PRN Agbata, Tochukwu, MD       Or  . ondansetron (ZOFRAN) injection 4 mg  4 mg Intravenous Q6H PRN Agbata, Tochukwu, MD      . pantoprazole (PROTONIX) EC tablet 40 mg  40 mg Oral Daily Agbata, Tochukwu, MD   40 mg at 06/18/20 1209  . simvastatin (ZOCOR) tablet 40 mg  40 mg Oral QHS Agbata, Tochukwu, MD      . sodium bicarbonate tablet 650 mg  650 mg Oral BID Agbata, Tochukwu, MD   650 mg at 06/18/20 1210   Current Outpatient Medications  Medication Sig Dispense Refill  . acetaminophen (TYLENOL) 500 MG tablet Take 1,000 mg by mouth in the morning and at bedtime.    Marland Kitchen amLODipine (NORVASC) 5 MG tablet Take 5 mg by mouth daily.    Marland Kitchen aspirin 325 MG tablet Take 325 mg by mouth in the morning and at bedtime.    Marland Kitchen  cetirizine (ZYRTEC) 10 MG tablet Take 10 mg by mouth daily.    . cholecalciferol (VITAMIN D3) 25 MCG (1000 UNIT) tablet Take 1,000 Units by mouth daily.    . hydrochlorothiazide (HYDRODIURIL) 25 MG tablet Take 25 mg by mouth daily.    Marland Kitchen lisinopril (ZESTRIL) 40 MG tablet Take 1 tablet by mouth daily.    . mupirocin ointment (BACTROBAN) 2 % Apply to affected areas of wounds QD after cleaning. 60 g 4  . omeprazole-sodium bicarbonate (ZEGERID) 40-1100 MG capsule Take 1 capsule by mouth daily before breakfast.    . simvastatin (ZOCOR) 40 MG tablet Take 40 mg by mouth at bedtime.    . sodium bicarbonate 650 MG tablet Take 650 mg by mouth 2 (two) times daily.    Marland Kitchen doxycycline (ADOXA) 100 MG tablet Take 1 tablet (100 mg total) by mouth 2 (two) times daily. Take with food (Patient not taking: Reported on 06/18/2020) 60 tablet 1    Allergies as of 06/17/2020 - Review Complete 06/17/2020  Allergen Reaction Noted  . Penicillins  02/21/2013  .  Montelukast Nausea Only 05/28/2013     Review of Systems:    All systems reviewed and negative except where noted in HPI, with the exception of some memory loss, skin cancers, fall as above.      Physical Exam:  Vital signs in last 24 hours: Temp:  [97.8 F (36.6 C)] 97.8 F (36.6 C) (10/12 2152) Pulse Rate:  [56-115] 87 (10/13 1300) Resp:  [18-32] 22 (10/13 1400) BP: (105-165)/(59-97) 155/59 (10/13 1443) SpO2:  [94 %-100 %] 94 % (10/13 1300) Weight:  [82.3 kg] 82.3 kg (10/12 2153)   General:   Pleasant elderly woman in NAD Head:  Normocephalic and atraumatic. Eyes:   No icterus.   Conjunctiva pink. Ears:  Normal auditory acuity. Mouth: Mucosa pink moist, no lesions. Neck:  Supple; no masses felt Lungs:  Respirations even and unlabored. Lungs clear to auscultation bilaterally.   No wheezes, crackles, or rhonchi.  Heart:  S1S2, RRR, no MRG. Trace generalized edema. Abdomen:   Flat, soft, nondistended, nontender. Normal bowel sounds. No appreciable masses or hepatomegaly. No rebound signs or other peritoneal signs. Rectal area- there is a stage 2 decub with some pink drainage. Stool loose, near black, soaked into sheet/diaper.  Msk:  Left hip incision MAEW x4, No clubbing or cyanosis. Strength 4/5. Symmetrical without gross deformities. Neurologic:  Alert and  oriented x3;  Cranial nerves II-XII intact.  Skin:  Warm, dry, pink without  or rashes. Several scattered nevi Psych:  Alert and cooperative. Normal affect.  LAB RESULTS: Recent Labs    06/17/20 2201  WBC 23.3*  HGB 12.0  HCT 37.7  PLT 528*   BMET Recent Labs    06/17/20 2201  NA 136  K 3.7  CL 101  CO2 20*  GLUCOSE 100*  BUN 40*  CREATININE 1.83*  CALCIUM 9.2   LFT Recent Labs    06/17/20 2201  PROT 8.3*  ALBUMIN 3.5  AST 19  ALT 12  ALKPHOS 124  BILITOT 0.8   PT/INR No results for input(s): LABPROT, INR in the last 72 hours.  STUDIES: CT ABDOMEN PELVIS WO CONTRAST  Result Date:  06/18/2020 CLINICAL DATA:  Emesis and diarrhea, recent hip fracture EXAM: CT ABDOMEN AND PELVIS WITHOUT CONTRAST TECHNIQUE: Multidetector CT imaging of the abdomen and pelvis was performed following the standard protocol without IV contrast. COMPARISON:  None. FINDINGS: Lower chest: Motion degraded imaging of the lung bases.  Some dependent atelectasis posteriorly. Normal heart size. No pericardial effusion. Coronary artery calcifications are present. Hepatobiliary: Few punctate calcifications within the liver may reflect sequela of prior granulomatous disease. No focal concerning liver lesion on this unenhanced CT. Smooth liver surface contour. Normal liver attenuation. There is nodular attenuation along the gallbladder wall which could reflect adherent biliary sludge or discrete thickening of the gallbladder wall itself. Mild surrounding pericholecystic inflammation is noted as well. No visible calcified gallstone. No biliary ductal dilatation. Pancreas: Partial fatty replacement of the pancreas. No pancreatic ductal dilatation or surrounding inflammatory changes. Spleen: Normal in size. No concerning splenic lesions. Adrenals/Urinary Tract: Normal adrenal glands. No visible concerning renal lesion. Moderate bilateral perinephric stranding is nonspecific. No urolithiasis or hydronephrosis. Urinary bladder is largely decompressed at the time of exam and therefore poorly evaluated by CT imaging. Circumferential bladder wall thickening is noted which is nonspecific given underdistention though with perivesicular hazy stranding does raise concern for possible cystitis. Stomach/Bowel: Distal esophagus and stomach are unremarkable. No small bowel thickening or dilatation. The appendix is surgically absent. Long segmental thickening of the colon pericolonic hazy stranding is seen from the mid transverse to the distal sigmoid which does not appear focally centered upon a culprit diverticulum despite the presence of  numerous distal colonic diverticula. No evidence of bowel obstruction. Vascular/Lymphatic: Atherosclerotic calcifications within the abdominal aorta and branch vessels. No aneurysm or ectasia. No enlarged abdominopelvic lymph nodes. Reproductive: Normal anteverted uterus. Parametrial calcifications are in often senescent finding. Quiescent appearance of the ovarian tissue. Other: Postsurgical changes seen along the lateral left hip with a large hypoattenuating collection extending to the skin surface measuring approximately 6.7 x 3 x 4.6 cm in size which could reflect a postoperative hematoma or seroma. Infection is not fully excluded though no soft tissue gas is evident. Musculoskeletal: Post open reduction internal fixation of a intertrochanteric left femur fracture with placement of an intramedullary nail and transcervical fixation pin. No acute hardware fracture or failure is clearly evident accounting for slight motion artifact and streak. Few displaced fracture fragments including a retracted fragment comprising the lesser trochanter are present. No other acute fracture or osseous abnormality of the abdomen or pelvis. Levocurvature of the lumbar spine apex L3. Multilevel degenerative changes are present in the imaged portions of the spine. Additional degenerative changes in the bilateral hips. IMPRESSION: 1. Long segmental thickening of the colon pericolonic hazy stranding from the splenic flexure to the distal sigmoid which does not appear focally centered upon a culprit diverticulum despite the presence of numerous distal colonic diverticula. Findings are suggestive of a segmental colitis which could include infectious, inflammatory or vascular etiologies the latter of which would likely involve the IMA distribution. 2. Nodular attenuation along the gallbladder wall which could reflect adherent tumefactive biliary sludge versus discrete thickening of the gallbladder wall itself. Mild surrounding  pericholecystic inflammation is noted as well. No visible calcified gallstone. Consider further evaluation with right upper quadrant ultrasound. 3. Circumferential bladder wall thickening with perivesicular hazy stranding concerning for cystitis. Additional nonspecific bilateral perinephric stranding, cannot exclude ascending tract infection. Correlate with urinalysis. 4. Post open reduction internal fixation of a intertrochanteric left femur fracture with a large hypoattenuating collection extending to the skin surface measuring approximately 6.7 x 3 x 4.6 cm in size which likely reflects a postoperative hematoma or seroma. Infection is less favored though cannot be fully excluded on an imaging basis. 5. Aortic Atherosclerosis (ICD10-I70.0). Electronically Signed   By: Lovena Le M.D.   On:  06/18/2020 00:07   US ABDOMEN LIMITED RUQ  Result Date: 06/18/2020 CLINICAL DATA:  Emesis and diarrhea, concern for acute cholecystitis on CT 06/17/2020 EXAM: ULTRASOUND ABDOMEN LIMITED RIGHT UPPER QUADRANT COMPARISON:  CT 06/17/2020 FINDINGS: Gallbladder: Non-mobile, mass-like, irregular, hyperechoic lesion along the gallbladder wall measuring 2.3 x 1.5 cm with no associated vascular flow. Gallbladder wall thickened measuring up to 6 mm. No sonographic Murphy sign noted by sonographer. Common bile duct: Diameter: 3 mm Liver: Scattered calcific densities better appreciated on CT and consistent with sequelae of prior granulomatous disease. No focal lesion identified. Within normal limits in parenchymal echogenicity. Portal vein is patent on color Doppler imaging with normal direction of blood flow towards the liver. Other: Increased right renal parenchymal echogenicity. No hydronephrosis of the right kidney. IMPRESSION: 1. Findings suggestive of tumefactive sludge. Gallbladder malignancy remains in the differential. Recommend follow-up ultrasound in 3 months or MRI/surgical consultation earlier if clinically indicated. 2.  Increased right renal parenchymal echogenicity suggestive of chronic renal disease. Electronically Signed   By: Iven Finn M.D.   On: 06/18/2020 04:01       Impression / Plan:   1. Abnormal GB- surgery consult/mrcp appreciated 2. Diarrhea illness/abnornal colon on ct-  This is likely infectious- has risk factors for cdiff. Agree with cdiff testing will add stool pcr. Further recommendations based on results/clinical course.  Thank you very much for this consult. These services were provided by Stephens November, NP-C, in collaboration with  Dr Andrey Farmer with whom I have discussed this patient in full.   Stephens November, NP-C  Addendum- patient also with stage 2 decub to sacral area- about quarter sized- recommend wound care per protocol. thanks

## 2020-06-18 NOTE — ED Notes (Signed)
Pt had a bout of black diarrhea stool, this writer cleaned pt up. NP at bedside.

## 2020-06-18 NOTE — ED Notes (Signed)
Pt resting comfortably with daughter at bedside. IVF and antibiotics infusing well. Pt has no co pain at this time, will continue to monitor.

## 2020-06-18 NOTE — ED Notes (Signed)
Attempted IV access x1, no succcess, will defer to another RN

## 2020-06-18 NOTE — ED Notes (Signed)
US at bedside

## 2020-06-18 NOTE — ED Notes (Signed)
Peri care provided and pt given a new brief and repositioned in bed. PT able to help with changing by rolling, pt tolerated well.

## 2020-06-18 NOTE — Consult Note (Signed)
SURGICAL CONSULTATION NOTE   HISTORY OF PRESENT ILLNESS (HPI):  79 y.o. female presented to [facility] ED for evaluation of diarrhea. Patient reports having multiple episode of diarrhea last few days.she also reports some intermittent abdominal pain.  The abdominal pain is periumbilical.  On the moment of ambulation the pain was resolved.  She denies nausea or vomiting.  Patient has history of left hip surgery..  Even though she has not received antibiotic therapy patient usually receives perioperative dose of antibiotic therapy.  At the ED she was found with leukocytosis of 23,000.  CT scan abdominal pelvis shows thickening of the large intestine suggestive of colitis.  Also there was a nodular appearance of the gallbladder wall.  Differential diagnoses are abscess such as sludge versus malignancy.  I personally evaluated the images.  At the moment evaluation patient with abdominal pain nausea or vomiting.  No tenderness of the patient.  No acute abdomen.  Surgery is consulted by Dr. Francine Graven in this context for evaluation and management of abnormal gallbladder ultrasound.  PAST MEDICAL HISTORY (PMH):  Past Medical History:  Diagnosis Date  . Basal cell carcinoma 12/21/2019   left ant shoulder  . 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 Authorizing Provider  acetaminophen (TYLENOL) 500 MG tablet Take 1,000 mg by mouth in the morning and at bedtime.   Yes [provider]  amLODipine (NORVASC) 5 MG tablet Take 5 mg by mouth daily. 12/05/19  Yes [provider]  aspirin 325 MG tablet Take 325 mg by mouth in the morning and at bedtime.   Yes [provider]  cetirizine (ZYRTEC) 10 MG tablet Take 10 mg by mouth daily.   Yes [provider]  cholecalciferol (VITAMIN D3) 25 MCG (1000  UNIT) tablet Take 1,000 Units by mouth daily.   Yes [provider]  hydrochlorothiazide (HYDRODIURIL) 25 MG tablet Take 25 mg by mouth daily. 01/01/20  Yes [provider]  lisinopril (ZESTRIL) 40 MG tablet Take 1 tablet by mouth daily. 01/02/20  Yes [provider]  mupirocin ointment (BACTROBAN) 2 % Apply to affected areas of wounds QD after cleaning. 04/08/20  Yes Brendolyn Patty, MD  omeprazole-sodium bicarbonate (ZEGERID) 40-1100 MG capsule Take 1 capsule by mouth daily before breakfast.   Yes [provider]  simvastatin (ZOCOR) 40 MG tablet Take 40 mg by mouth at bedtime. 01/01/20  Yes [provider]  sodium bicarbonate 650 MG tablet Take 650 mg by mouth 2 (two) times daily. 01/03/20  Yes [provider]  doxycycline (ADOXA) 100 MG tablet Take 1 tablet (100 mg total) by mouth 2 (two) times daily. Take with food Patient not taking: Reported on 06/18/2020 04/08/20   Brendolyn Patty, MD     ALLERGIES:  Allergies  Allergen Reactions  . Penicillins   . Montelukast Nausea Only     SOCIAL HISTORY:  Social History   Socioeconomic History  . Marital status: Married    Spouse name: Not on file  . Number of children: Not on file  . Years of education: Not on file  . Highest education level: Not on file  Occupational History  . Not on file  Tobacco Use  . Smoking status: Former Research scientist (life sciences)  . Smokeless tobacco: Never Used  . Tobacco comment: 20 years ago  Substance and Sexual Activity  . Alcohol use: Not Currently  . Drug use: Not Currently  . Sexual activity: Not Currently  Other Topics Concern  . Not on file  Social History Narrative  . Not on file   Social Determinants of Health   Financial Resource Strain:   . Difficulty of Paying Living Expenses: Not on file  Food Insecurity:   . Worried About Charity fundraiser in the Last Year: Not on file  . Ran Out of Food in the Last Year: Not on file  Transportation Needs:   . Lack of  Transportation (Medical): Not on file  . Lack of Transportation (Non-Medical): Not on file  Physical Activity:   . Days of Exercise per Week: Not on file  . Minutes of Exercise per Session: Not on file  Stress:   . Feeling of Stress : Not on file  Social Connections:   . Frequency of Communication with Friends and Family: Not on file  . Frequency of Social Gatherings with Friends and Family: Not on file  . Attends Religious Services: Not on file  . Active Member of Clubs or Organizations: Not on file  . Attends Archivist Meetings: Not on file  . Marital Status: Not on file  Intimate Partner Violence:   . Fear of Current or Ex-Partner: Not on file  . Emotionally Abused: Not on file  . Physically Abused: Not on file  . Sexually Abused: Not on file      FAMILY HISTORY:  No family history on file.   REVIEW OF SYSTEMS:  Constitutional: denies weight loss, fever, chills, or sweats  Eyes: denies any other vision changes, history of eye injury  ENT: denies sore throat, hearing problems  Respiratory: denies shortness of breath, wheezing  Cardiovascular: denies chest pain, palpitations  Gastrointestinal: abdominal pain and diarrhea, negative for nausea and vomiting Genitourinary: denies burning with urination or urinary frequency Musculoskeletal: denies any other joint pains or cramps  Skin: denies any other rashes or skin discolorations  Neurological: denies any other headache, dizziness, weakness  Psychiatric: denies any other depression, anxiety   All other review of systems were negative   VITAL SIGNS:  Temp:  [97.8 F (36.6 C)] 97.8 F (36.6 C) (10/12 2152) Pulse Rate:  [56-115] 87 (10/13 1300) Resp:  [18-32] 22 (10/13 1400) BP: (105-165)/(59-97) 155/59 (10/13 1443) SpO2:  [94 %-100 %] 94 % (10/13 1300) Weight:  [82.3 kg] 82.3 kg (10/12 2153)     Height: 5\' 2"  (157.5 cm) Weight: 82.3 kg BMI (Calculated): 33.18   INTAKE/OUTPUT:  This shift: No intake/output  data recorded.  Last 2 shifts: @IOLAST2SHIFTS @   PHYSICAL EXAM:  Constitutional:  -- Normal body habitus  -- Awake, alert, and oriented x3  Eyes:  -- Pupils equally round and reactive to light  -- No scleral icterus  Ear, nose, and throat:  -- No jugular venous distension  Pulmonary:  -- No crackles  -- Equal breath sounds bilaterally -- Breathing non-labored at rest Cardiovascular:  -- S1, S2 present  -- No pericardial rubs Gastrointestinal:  -- Abdomen soft, nontender, non-distended, no guarding or rebound tenderness -- No abdominal masses appreciated, pulsatile or otherwise  Musculoskeletal and Integumentary:  -- Wounds: Right hip wound dry and clean -- Extremities: B/L UE and LE FROM, hands and feet warm, no edema  Neurologic:  -- Motor function: intact and symmetric -- Sensation: intact and symmetric   Labs:  CBC Latest Ref Rng & Units 06/17/2020  WBC 4.0 - 10.5 K/uL 23.3(H)  Hemoglobin 12.0 - 15.0 g/dL 12.0  Hematocrit 36 - 46 % 37.7  Platelets 150 - 400 K/uL 528(H)   CMP Latest Ref Rng & Units 06/17/2020  Glucose 70 - 99 mg/dL 100(H)  BUN 8 - 23 mg/dL 40(H)  Creatinine 0.44 - 1.00 mg/dL 1.83(H)  Sodium 135 - 145 mmol/L 136  Potassium 3.5 - 5.1 mmol/L 3.7  Chloride 98 - 111 mmol/L 101  CO2 22 - 32 mmol/L 20(L)  Calcium 8.9 - 10.3 mg/dL 9.2  Total Protein 6.5 - 8.1 g/dL 8.3(H)  Total Bilirubin 0.3 - 1.2 mg/dL 0.8  Alkaline Phos 38 - 126 U/L 124  AST 15 - 41 U/L 19  ALT 0 - 44 U/L 12    Imaging studies:  EXAM: ULTRASOUND ABDOMEN LIMITED RIGHT UPPER QUADRANT  COMPARISON:  CT 06/17/2020  FINDINGS: Gallbladder:  Non-mobile, mass-like, irregular, hyperechoic lesion along the gallbladder wall measuring 2.3 x 1.5 cm with no associated vascular flow. Gallbladder wall thickened measuring up to 6 mm. No sonographic Murphy sign noted by sonographer.  Common bile duct:  Diameter: 3 mm  Liver:  Scattered calcific densities better appreciated on  CT and consistent with sequelae of prior granulomatous disease. No focal lesion identified. Within normal limits in parenchymal echogenicity. Portal vein is patent on color Doppler imaging with normal direction of blood flow towards the liver.  Other: Increased right renal parenchymal echogenicity. No hydronephrosis of the right kidney.  IMPRESSION: 1. Findings suggestive of tumefactive sludge. Gallbladder malignancy remains in the differential. Recommend follow-up ultrasound in 3 months or MRI/surgical consultation earlier if clinically indicated. 2. Increased right renal parenchymal echogenicity suggestive of chronic renal disease.   Electronically Signed   By: Iven Finn M.D.   On: 06/18/2020 04:01   EXAM: CT ABDOMEN AND PELVIS WITHOUT CONTRAST  TECHNIQUE: Multidetector CT imaging of the abdomen and pelvis was performed following the standard protocol without IV contrast.  COMPARISON:  None.  FINDINGS: Lower chest: Motion degraded imaging of the lung bases. Some dependent atelectasis posteriorly. Normal heart size. No pericardial effusion. Coronary artery calcifications are present.  Hepatobiliary: Few punctate calcifications within the liver may reflect sequela of prior granulomatous disease. No focal concerning liver lesion on this unenhanced CT. Smooth liver surface contour. Normal liver attenuation. There is nodular attenuation along the gallbladder wall which could reflect adherent biliary sludge or discrete thickening of the gallbladder wall itself. Mild surrounding pericholecystic inflammation is noted as well. No visible calcified gallstone. No biliary ductal dilatation.  Pancreas: Partial fatty replacement of the pancreas. No pancreatic ductal dilatation or surrounding inflammatory changes.  Spleen: Normal in size. No concerning splenic lesions.  Adrenals/Urinary Tract: Normal adrenal glands. No visible concerning renal lesion. Moderate  bilateral perinephric stranding is nonspecific. No urolithiasis or hydronephrosis. Urinary bladder is largely decompressed at the time of exam and therefore poorly evaluated by CT imaging. Circumferential bladder wall thickening is noted which is nonspecific given underdistention though with perivesicular hazy stranding does raise concern for possible cystitis.  Stomach/Bowel: Distal esophagus and stomach are unremarkable. No small bowel thickening or dilatation. The appendix is surgically absent. Long segmental thickening of the colon pericolonic hazy stranding is seen from the mid transverse to the distal sigmoid which does not appear focally centered upon a culprit diverticulum despite the presence of numerous distal colonic diverticula. No evidence of bowel obstruction.  Vascular/Lymphatic: Atherosclerotic calcifications within the abdominal aorta and branch vessels. No aneurysm or ectasia. No enlarged abdominopelvic lymph  nodes.  Reproductive: Normal anteverted uterus. Parametrial calcifications are in often senescent finding. Quiescent appearance of the ovarian tissue.  Other: Postsurgical changes seen along the lateral left hip with a large hypoattenuating collection extending to the skin surface measuring approximately 6.7 x 3 x 4.6 cm in size which could reflect a postoperative hematoma or seroma. Infection is not fully excluded though no soft tissue gas is evident.  Musculoskeletal: Post open reduction internal fixation of a intertrochanteric left femur fracture with placement of an intramedullary nail and transcervical fixation pin. No acute hardware fracture or failure is clearly evident accounting for slight motion artifact and streak. Few displaced fracture fragments including a retracted fragment comprising the lesser trochanter are present. No other acute fracture or osseous abnormality of the abdomen or pelvis. Levocurvature of the lumbar spine apex  L3. Multilevel degenerative changes are present in the imaged portions of the spine. Additional degenerative changes in the bilateral hips.  IMPRESSION: 1. Long segmental thickening of the colon pericolonic hazy stranding from the splenic flexure to the distal sigmoid which does not appear focally centered upon a culprit diverticulum despite the presence of numerous distal colonic diverticula. Findings are suggestive of a segmental colitis which could include infectious, inflammatory or vascular etiologies the latter of which would likely involve the IMA distribution. 2. Nodular attenuation along the gallbladder wall which could reflect adherent tumefactive biliary sludge versus discrete thickening of the gallbladder wall itself. Mild surrounding pericholecystic inflammation is noted as well. No visible calcified gallstone. Consider further evaluation with right upper quadrant ultrasound. 3. Circumferential bladder wall thickening with perivesicular hazy stranding concerning for cystitis. Additional nonspecific bilateral perinephric stranding, cannot exclude ascending tract infection. Correlate with urinalysis. 4. Post open reduction internal fixation of a intertrochanteric left femur fracture with a large hypoattenuating collection extending to the skin surface measuring approximately 6.7 x 3 x 4.6 cm in size which likely reflects a postoperative hematoma or seroma. Infection is less favored though cannot be fully excluded on an imaging basis. 5. Aortic Atherosclerosis (ICD10-I70.0).   Electronically Signed   By: Lovena Le M.D.   On: 06/18/2020 00:07  Assessment/Plan:  79 y.o. female with abnormal gallbladder ultrasound suspected/versus malignancy, complicated by pertinent comorbidities including diarrhea and colitis.  Patient with leukocytosis of unknown etiology.  No fever.  At the moment of my evaluation patient without abdominal pain.  She has been having diarrhea for  the last few days.  Currently on work-up for C. difficile colitis.  I agree with this work-up even though patient just had most likely 1 dose of antibiotic perioperatively for the hip surgery this could be enough to develop C. difficile colitis.  Patient does not have pain on the right upper quadrant.  I agree to proceed with further evaluation of the gallbladder to rule out any more serious conditions such as malignancy.  If MRI is able to appreciate that this is large I think that this should be treated as incidental sludge finding and not as symptomatic cholelithiasis.  Depending on the timing of the MRI, from a standpoint patient can start with clear liquids.  We will continue to follow along to assist with management and recommendation for this patient.  Arnold Long, MD

## 2020-06-18 NOTE — ED Notes (Signed)
Pt resting comfortably at this time. No current distress noted. Pt denies any further needs. VSS. Call bell in reach.

## 2020-06-18 NOTE — ED Notes (Signed)
Pt resting comfortably waiting admission

## 2020-06-18 NOTE — ED Notes (Signed)
Pt resting comfortably at this time. No distress noted. VSS. Call bell in reach.  

## 2020-06-18 NOTE — H&P (Signed)
History and Physical    Michelle Guerrero YQM:[id number] DOB: March 12, 1941 DOA: 06/17/2020  PCP: Nanda Quinton, MD   Patient coming from: Home  I have personally briefly reviewed patient's old medical records in [place]  Chief Complaint: Diarrhea  HPI: Michelle Guerrero is a 79 y.o. 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  Patient was discharged from rehab where she was placed after repair of a hip fracture 1 day prior to her hospitalization.  She denies having any fever or chills.  She denies having any sick contacts.  She has no abdominal pain and denies any NSAID use.  No prior history of C. difficile diarrhea or recent antibiotic use. She denies having any chest pain, shortness of breath, fever, chills, headache, dizziness or lightheadedness.  She denies having any urinary symptoms. Labs show sodium 136, potassium 3.7, chloride 101, bicarb 20, glucose 100, BUN 40, creatinine 1.83, calcium 9.2, alkaline phosphatase 124, albumin 3.5, AST 19, ALT 12, total protein 8.3, lactic acid 1.0, white count of 23.3, hemoglobin 12.0, hematocrit 37, MCV 93.5, RDW 15.6, platelet count 528 CT scan of abdomen and pelvis shows a long segment thickening of the colon pericolonic hazy stranding from the splenic flexure to the distal sigmoid which does not appear focally centered upon a culprit diverticulum despite the presence of numerous distal colonic diverticula.  Findings are suggestive of a segmental colitis which could include infectious, inflammatory or vascular etiology.  Nodular attenuation along the gallbladder wall which could reflect adherent biliary sludge versus discrete thickening of the gallbladder wall itself.  Mild surrounding pericholecystic inflammation is  noted as well.  No visible calcified gallstone.  Circumferential bladder wall thickening with perivesical hazy stranding concerning for cystitis. Gallbladder ultrasound which showed findings suggestive of tumefactive sludge.  Gallbladder malignancy remains in the differential.  Recommend follow-up ultrasound in 3 months or surgical consultation earlier if clinically indicated.  Increased right renal parenchymal echogenicity suggestive of chronic renal disease. Twelve-lead EKG reviewed by me shows sinus tachycardia    ED Course: Patient is a 79 year old female who was recently discharged from subacute rehab following a hip replacement surgery.  She presents to the emergency room for evaluation of 2 episodes of loose watery diarrhea with streaks of blood.  She has marked leukocytosis and imaging suggestive of possible colitis.  Patient received a dose of Flagyl and Rocephin in the ER.  Patient will be admitted to the hospital for further evaluation.  Review of Systems: As per HPI otherwise 10 point review of systems negative.    Past Medical History:  Diagnosis Date  . Basal cell carcinoma 12/21/2019   left ant shoulder  . 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   reports that she has quit smoking. She has never used smokeless tobacco. She reports previous alcohol use. She reports previous drug use.  Allergies  Allergen Reactions  . Penicillins   . Montelukast Nausea Only    No family history on file.   Prior to Admission medications   Medication Sig Start Date End Date Taking? Authorizing Provider  acetaminophen (TYLENOL) 500 MG tablet Take 1,000 mg by mouth in the morning and at bedtime.   Yes  [provider]  amLODipine (NORVASC) 5 MG tablet Take 5 mg by mouth daily. 12/05/19  Yes [provider]  aspirin 325 MG tablet Take 325 mg by mouth in the morning and at bedtime.   Yes  [provider]  cetirizine (ZYRTEC) 10 MG tablet Take 10 mg by mouth daily.   Yes [provider]  cholecalciferol (VITAMIN D3) 25 MCG (1000 UNIT) tablet Take 1,000 Units by mouth daily.   Yes [provider]  hydrochlorothiazide (HYDRODIURIL) 25 MG tablet Take 25 mg by mouth daily. 01/01/20  Yes [provider]  lisinopril (ZESTRIL) 40 MG tablet Take 1 tablet by mouth daily. 01/02/20  Yes [provider]  mupirocin ointment (BACTROBAN) 2 % Apply to affected areas of wounds QD after cleaning. 04/08/20  Yes Brendolyn Patty, MD  omeprazole-sodium bicarbonate (ZEGERID) 40-1100 MG capsule Take 1 capsule by mouth daily before breakfast.   Yes [provider]  simvastatin (ZOCOR) 40 MG tablet Take 40 mg by mouth at bedtime. 01/01/20  Yes [provider]  sodium bicarbonate 650 MG tablet Take 650 mg by mouth 2 (two) times daily. 01/03/20  Yes [provider]  doxycycline (ADOXA) 100 MG tablet Take 1 tablet (100 mg total) by mouth 2 (two) times daily. Take with food Patient not taking: Reported on 06/18/2020 04/08/20   Brendolyn Patty, MD    Physical Exam: Vitals:   06/18/20 0500 06/18/20 0530 06/18/20 0900 06/18/20 1000  BP: (!) 161/64 (!) 159/62 (!) 145/73 139/66  Pulse: 85 (!) 115 91 90  Resp: (!) 32 (!) 29 (!) 30 (!) 29  Temp:      TempSrc:      SpO2: 98% 96% 96% 96%  Weight:      Height:         Vitals:   06/18/20 0500 06/18/20 0530 06/18/20 0900 06/18/20 1000  BP: (!) 161/64 (!) 159/62 (!) 145/73 139/66  Pulse: 85 (!) 115 91 90  Resp: (!) 32 (!) 29 (!) 30 (!) 29  Temp:      TempSrc:      SpO2: 98% 96% 96% 96%  Weight:      Height:        Constitutional: NAD, alert and oriented x 3 Eyes: PERRL, lids and conjunctivae normal ENMT: Mucous membranes are moist.  Neck: normal, supple, no masses, no thyromegaly Respiratory: clear to auscultation bilaterally, no wheezing, no crackles. Normal respiratory effort. No accessory  muscle use.  Cardiovascular: Regular rate and rhythm, no murmurs / rubs / gallops. No extremity edema. 2+ pedal pulses. No carotid bruits.  Abdomen: no tenderness, no masses palpated. No hepatosplenomegaly. Bowel sounds positive.  Musculoskeletal: no clubbing / cyanosis. No joint deformity upper and lower extremities.  Skin: no rashes, lesions, ulcers.  Neurologic: No gross focal neurologic deficit. Psychiatric: Normal mood and affect.   Labs on Admission: I have personally reviewed following labs and imaging studies  CBC: Recent Labs  Lab 06/17/20 2201  WBC 23.3*  HGB 12.0  HCT 37.7  MCV 93.5  PLT 161*   Basic Metabolic Panel: Recent Labs  Lab 06/17/20 2201  NA 136  K 3.7  CL 101  CO2 20*  GLUCOSE 100*  BUN 40*  CREATININE 1.83*  CALCIUM 9.2   GFR: Estimated Creatinine Clearance: 25.6 mL/min (A) (by C-G formula based on SCr of 1.83 mg/dL (H)). Liver Function Tests: Recent Labs  Lab 06/17/20 2201  AST 19  ALT 12  ALKPHOS 124  BILITOT 0.8  PROT 8.3*  ALBUMIN 3.5   No results for input(s): LIPASE, AMYLASE in the last 168 hours. No results for input(s): AMMONIA in the last 168 hours. Coagulation Profile: No results for input(s): INR, PROTIME in the last 168 hours. Cardiac Enzymes: No results for input(s): CKTOTAL, CKMB, CKMBINDEX, TROPONINI in the last 168 hours. BNP (last 3 results) No results for input(s): PROBNP in the last 8760 hours. HbA1C: No results for input(s): HGBA1C in the last 72 hours. CBG: No results for input(s): GLUCAP in the last 168 hours. Lipid Profile: No results for input(s): CHOL, HDL, LDLCALC, TRIG, CHOLHDL, LDLDIRECT in the last 72 hours. Thyroid Function Tests: No results for input(s): TSH, T4TOTAL, FREET4, T3FREE, THYROIDAB in the last 72 hours. Anemia Panel: No results for input(s): VITAMINB12, FOLATE, FERRITIN, TIBC, IRON, RETICCTPCT in the last 72 hours. Urine analysis:    Component Value Date/Time   COLORURINE YELLOW (A)  06/18/2020 0450   APPEARANCEUR HAZY (A) 06/18/2020 0450   LABSPEC 1.019 06/18/2020 0450   PHURINE 5.0 06/18/2020 0450   GLUCOSEU NEGATIVE 06/18/2020 0450   HGBUR NEGATIVE 06/18/2020 0450   BILIRUBINUR NEGATIVE 06/18/2020 0450   KETONESUR 20 (A) 06/18/2020 0450   PROTEINUR NEGATIVE 06/18/2020 0450   NITRITE NEGATIVE 06/18/2020 0450   LEUKOCYTESUR NEGATIVE 06/18/2020 0450    Radiological Exams on Admission: CT ABDOMEN PELVIS WO CONTRAST  Result Date: 06/18/2020 CLINICAL DATA:  Emesis and diarrhea, recent hip fracture EXAM: CT ABDOMEN AND PELVIS WITHOUT CONTRAST TECHNIQUE: Multidetector CT imaging of the abdomen and pelvis was performed following the standard protocol without IV contrast. COMPARISON:  None. FINDINGS: Lower chest: Motion degraded imaging of the lung bases. Some dependent atelectasis posteriorly. Normal heart size. No pericardial effusion. Coronary artery calcifications are present. Hepatobiliary: Few punctate calcifications within the liver may reflect sequela of prior granulomatous disease. No focal concerning liver lesion on this unenhanced CT. Smooth liver surface contour. Normal liver attenuation. There is nodular attenuation along the gallbladder wall which could reflect adherent biliary sludge or discrete thickening of the gallbladder wall itself. Mild surrounding pericholecystic inflammation is noted as well. No visible calcified gallstone. No biliary ductal dilatation. Pancreas: Partial fatty replacement of the pancreas. No pancreatic ductal dilatation or surrounding inflammatory changes. Spleen: Normal in size. No concerning splenic lesions. Adrenals/Urinary Tract: Normal adrenal glands. No visible concerning renal lesion. Moderate bilateral perinephric stranding is nonspecific. No urolithiasis or hydronephrosis. Urinary bladder is largely decompressed at the time of exam and therefore poorly evaluated by CT imaging. Circumferential bladder wall thickening is noted which is  nonspecific given underdistention though with perivesicular hazy stranding does raise concern for possible cystitis. Stomach/Bowel: Distal esophagus and stomach are unremarkable. No small bowel thickening or dilatation. The appendix is surgically absent. Long segmental thickening of the colon pericolonic hazy stranding is seen from the mid transverse to the distal sigmoid which does not appear focally centered upon a culprit diverticulum despite the presence of numerous distal colonic diverticula. No evidence of bowel obstruction. Vascular/Lymphatic: Atherosclerotic calcifications within the abdominal aorta and branch vessels. No aneurysm or ectasia. No enlarged abdominopelvic lymph nodes. Reproductive: Normal anteverted uterus. Parametrial calcifications are in often senescent finding. Quiescent appearance of the ovarian tissue. Other: Postsurgical changes seen along the lateral left hip with a large hypoattenuating collection extending to the skin surface measuring approximately 6.7 x 3 x 4.6 cm in size which could reflect a postoperative hematoma or seroma. Infection is not fully excluded though no soft tissue gas is evident. Musculoskeletal: Post open reduction internal  fixation of a intertrochanteric left femur fracture with placement of an intramedullary nail and transcervical fixation pin. No acute hardware fracture or failure is clearly evident accounting for slight motion artifact and streak. Few displaced fracture fragments including a retracted fragment comprising the lesser trochanter are present. No other acute fracture or osseous abnormality of the abdomen or pelvis. Levocurvature of the lumbar spine apex L3. Multilevel degenerative changes are present in the imaged portions of the spine. Additional degenerative changes in the bilateral hips. IMPRESSION: 1. Long segmental thickening of the colon pericolonic hazy stranding from the splenic flexure to the distal sigmoid which does not appear focally  centered upon a culprit diverticulum despite the presence of numerous distal colonic diverticula. Findings are suggestive of a segmental colitis which could include infectious, inflammatory or vascular etiologies the latter of which would likely involve the IMA distribution. 2. Nodular attenuation along the gallbladder wall which could reflect adherent tumefactive biliary sludge versus discrete thickening of the gallbladder wall itself. Mild surrounding pericholecystic inflammation is noted as well. No visible calcified gallstone. Consider further evaluation with right upper quadrant ultrasound. 3. Circumferential bladder wall thickening with perivesicular hazy stranding concerning for cystitis. Additional nonspecific bilateral perinephric stranding, cannot exclude ascending tract infection. Correlate with urinalysis. 4. Post open reduction internal fixation of a intertrochanteric left femur fracture with a large hypoattenuating collection extending to the skin surface measuring approximately 6.7 x 3 x 4.6 cm in size which likely reflects a postoperative hematoma or seroma. Infection is less favored though cannot be fully excluded on an imaging basis. 5. Aortic Atherosclerosis (ICD10-I70.0). Electronically Signed   By: Lovena Le M.D.   On: 06/18/2020 00:07   US ABDOMEN LIMITED RUQ  Result Date: 06/18/2020 CLINICAL DATA:  Emesis and diarrhea, concern for acute cholecystitis on CT 06/17/2020 EXAM: ULTRASOUND ABDOMEN LIMITED RIGHT UPPER QUADRANT COMPARISON:  CT 06/17/2020 FINDINGS: Gallbladder: Non-mobile, mass-like, irregular, hyperechoic lesion along the gallbladder wall measuring 2.3 x 1.5 cm with no associated vascular flow. Gallbladder wall thickened measuring up to 6 mm. No sonographic Murphy sign noted by sonographer. Common bile duct: Diameter: 3 mm Liver: Scattered calcific densities better appreciated on CT and consistent with sequelae of prior granulomatous disease. No focal lesion identified. Within  normal limits in parenchymal echogenicity. Portal vein is patent on color Doppler imaging with normal direction of blood flow towards the liver. Other: Increased right renal parenchymal echogenicity. No hydronephrosis of the right kidney. IMPRESSION: 1. Findings suggestive of tumefactive sludge. Gallbladder malignancy remains in the differential. Recommend follow-up ultrasound in 3 months or MRI/surgical consultation earlier if clinically indicated. 2. Increased right renal parenchymal echogenicity suggestive of chronic renal disease. Electronically Signed   By: Iven Finn M.D.   On: 06/18/2020 04:01    EKG: Independently reviewed.  Sinus tachycardia  Assessment/Plan Principal Problem:   Acute colitis Active Problems:   Type 2 diabetes mellitus with microalbuminuria (HCC)   CKD (chronic kidney disease), stage IV (HCC)   Essential hypertension   GERD (gastroesophageal reflux disease)      Acute colitis Patient presents to the emergency room for evaluation of loose watery diarrhea with streaks of blood. She has marked leukocytosis with a left shift but denies any recent antibiotic use Follow-up results of C. difficile toxin We will place patient on enteric precautions until stool studies become available We will hold off on antibiotic therapy for now until C. difficile colitis is ruled out Consult GI   Type 2 diabetes mellitus with complications of stage IV  chronic kidney disease Place patient on consistent carbohydrate diet Sliding scale insulin for glycemic control Continue sodium bicarbonate    Hypertension Continue hydrochlorothiazide, amlodipine and lisinopril.    GERD Continue oral PPI    Abnormal gallbladder imaging Patient had an ultrasound which showed findings suggestive of tumefactive sludge.  Gallbladder malignancy remains in the differential. Follow-up ultrasound is recommended in 3 months or MRI recommended We will request surgical consult   DVT  prophylaxis: SCD Code Status: Full code Family Communication: Greater than 50% of time was spent discussing patient's condition and plan of care with her at the bedside.  All questions and concerns have been addressed.  She verbalizes understanding and agrees with the plan. Disposition Plan: Back to previous home environment Consults called: Gastroenterology/surgery    Collier Bullock MD [facility]     06/18/2020, 10:36 AM

## 2020-06-18 NOTE — ED Notes (Signed)
Daughter updated on plan of care 

## 2020-06-18 NOTE — ED Notes (Signed)
Report received from end-shift RN. Patient care assumed. Patient/RN introduction complete. Will continue to monitor.  

## 2020-06-19 ENCOUNTER — Other Ambulatory Visit: Payer: Self-pay | Admitting: General Surgery

## 2020-06-19 DIAGNOSIS — R197 Diarrhea, unspecified: Secondary | ICD-10-CM | POA: Diagnosis not present

## 2020-06-19 DIAGNOSIS — K219 Gastro-esophageal reflux disease without esophagitis: Secondary | ICD-10-CM

## 2020-06-19 DIAGNOSIS — K828 Other specified diseases of gallbladder: Secondary | ICD-10-CM | POA: Insufficient documentation

## 2020-06-19 DIAGNOSIS — N179 Acute kidney failure, unspecified: Secondary | ICD-10-CM

## 2020-06-19 DIAGNOSIS — I10 Essential (primary) hypertension: Secondary | ICD-10-CM | POA: Diagnosis not present

## 2020-06-19 DIAGNOSIS — N189 Chronic kidney disease, unspecified: Secondary | ICD-10-CM

## 2020-06-19 LAB — C DIFFICILE QUICK SCREEN W PCR REFLEX
C Diff antigen: NEGATIVE
C Diff interpretation: NOT DETECTED
C Diff toxin: NEGATIVE

## 2020-06-19 LAB — CBC
HCT: 32.5 % — ABNORMAL LOW (ref 36.0–46.0)
Hemoglobin: 10.4 g/dL — ABNORMAL LOW (ref 12.0–15.0)
MCH: 30.1 pg (ref 26.0–34.0)
MCHC: 32 g/dL (ref 30.0–36.0)
MCV: 94.2 fL (ref 80.0–100.0)
Platelets: 324 10*3/uL (ref 150–400)
RBC: 3.45 MIL/uL — ABNORMAL LOW (ref 3.87–5.11)
RDW: 15.2 % (ref 11.5–15.5)
WBC: 18.8 10*3/uL — ABNORMAL HIGH (ref 4.0–10.5)
nRBC: 0 % (ref 0.0–0.2)

## 2020-06-19 LAB — GASTROINTESTINAL PANEL BY PCR, STOOL (REPLACES STOOL CULTURE)

## 2020-06-19 LAB — BASIC METABOLIC PANEL
Anion gap: 12 (ref 5–15)
BUN: 27 mg/dL — ABNORMAL HIGH (ref 8–23)
CO2: 20 mmol/L — ABNORMAL LOW (ref 22–32)
Calcium: 8.5 mg/dL — ABNORMAL LOW (ref 8.9–10.3)
Chloride: 102 mmol/L (ref 98–111)
Creatinine, Ser: 1.33 mg/dL — ABNORMAL HIGH (ref 0.44–1.00)
GFR, Estimated: 38 mL/min — ABNORMAL LOW (ref >60)
Glucose, Bld: 84 mg/dL (ref 70–99)
Potassium: 3.8 mmol/L (ref 3.5–5.1)
Sodium: 134 mmol/L — ABNORMAL LOW (ref 135–145)

## 2020-06-19 MED ORDER — LOPERAMIDE HCL 2 MG PO CAPS
2.0000 mg | ORAL_CAPSULE | Freq: Three times a day (TID) | ORAL | Status: DC | PRN
  Filled 2020-06-19: qty 1

## 2020-06-19 MED ORDER — METRONIDAZOLE IN NACL 5-0.79 MG/ML-% IV SOLN
500.0000 mg | Freq: Three times a day (TID) | INTRAVENOUS | Status: DC
  Administered 2020-06-19 – 2020-06-23 (×11): 500 mg via INTRAVENOUS
  Filled 2020-06-19 (×14): qty 100

## 2020-06-19 MED ORDER — CIPROFLOXACIN HCL 250 MG PO TABS
250.0000 mg | ORAL_TABLET | Freq: Two times a day (BID) | ORAL | Status: AC
  Administered 2020-06-19 – 2020-06-24 (×11): 250 mg via ORAL
  Filled 2020-06-19 (×6): qty 0.5
  Filled 2020-06-19: qty 1
  Filled 2020-06-19 (×5): qty 0.5

## 2020-06-19 MED ORDER — SODIUM CHLORIDE 0.9 % IV SOLN: INTRAVENOUS | Status: DC

## 2020-06-19 NOTE — ED Notes (Signed)
Pt brief changed at this time by this RN. Pt had small bowel movement of diarrhea. Not enough obtained for sample. Pt cleaned up and placed in new brief. Pt comfortable in bed, hooked up to new purwick. Will continue to monitor.

## 2020-06-19 NOTE — Progress Notes (Signed)
Pt to be admitted to 1A-150, Report given to [facility]. All belongings sent with pt.

## 2020-06-19 NOTE — Consult Note (Addendum)
[facility] Nurse Consult Note: Reason for Consult: Consult requested for left arm wound. Pt previously had surgical removal of Basal cell carcinoma to left upper arm and squamous cell carcinoma to left lower arm on 12/21/19. Refer to previous progress notes and photos in the EMR from the dermatology team.  She was last seen in their clinic on 9/7 and wounds have greatly improved since that time.  Wound type: Left lower arm previous wound has healed and is pink dry intact scar tissue. Left upper arm is a chronic full thickness wound; 6X7X.2cm, 90% red and moist, 10% yellow slough to edges, mod amt tan drainage, no odor or fluctuance.  Dressing procedure/placement/frequency: Continue present plan of care as previously ordered by the dermatology team:  Topical treatment orders provided for staff nurses to perform daily as follows to absorb drainage and promote drying and healing: Change dressing to left upper arm Q day as follows:  Moisten previous dressing with NS to assist with removal each time, then pat area dry. Apply piece of Calcium Alginate Kellie Simmering # [phone]) over wound, then cover with foam dressing.  (Change foam dressing Q 3 days or PRN soiling.   Pt should resume follow-up with dermatology team after discharge.  Please re-consult if further assistance is needed.  Thank-you,  Julien Girt MSN, [place], [place], [place], [place]

## 2020-06-19 NOTE — Evaluation (Signed)
Physical Therapy Evaluation Patient Details Name: Michelle Guerrero MRN: [id number] DOB: September 13, 1940 Today's Date: 06/19/2020   History of Present Illness  Michelle Guerrero is a 79 y.o. 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 MD assessment includes acute collitis.  Clinical Impression  PT was alert and oriented x4 but was not a fully accurate historian. The pt's daughter was contacted to confirm living situation. Pt reported feeling sick but agreed to perform bed mobility. Pt was able to come sitting on EOB with Min guard. Pt reported dizziness and BP was 149/88 in sitting as compared to recent 149/ 76 in supine. Pt was able to maintain balance at EOB and perform some therex. Pt declined transfers and ambulation secondary to feeling weak and dizzy. Pt required Min A for L LE management to return to supine. Pt was able to perform bed level therex with no increased fatigue or pain. Pt will benefit from PT services in a SNF setting upon discharge to safely address deficits listed in patient problem list for decreased caregiver assistance and eventual return to PLOF.     Follow Up Recommendations SNF    Equipment Recommendations  None recommended by PT    Recommendations for Other Services       Precautions / Restrictions Precautions Precaution Comments: L Hip s/p ORIF approx. 3 weeks ago Restrictions Weight Bearing Restrictions: Yes RLE Weight Bearing: Weight bearing as tolerated      Mobility  Bed Mobility Overal bed mobility: Needs Assistance Bed Mobility: Sit to Supine;Supine to Sit     Supine to sit: Min guard Sit to supine: Min assist   General bed mobility comments: pt requires increased time and effort to complete bed mobility. to  come from sitting, pt requires Min A for limb management  Transfers                 General transfer comment: pt declined secondary to feeling weak and sick  Ambulation/Gait             General Gait Details: pt declined secondary to feeling weak and sick  Stairs            Wheelchair Mobility    Modified Rankin (Stroke Patients Only)       Balance Overall balance assessment: Needs assistance   Sitting balance-Leahy Scale: Good Sitting balance - Comments: pt able tp maintain queit sitting balance and able to maintain EOB balance with acticipatory perturbations       Standing balance comment: not assessed                             Pertinent Vitals/Pain Pain Assessment: Faces Faces Pain Scale: Hurts a little bit Pain Location: L leg Pain Descriptors / Indicators: Aching Pain Intervention(s): Limited activity within patient's tolerance;Monitored during session;Repositioned    Home Living Family/patient expects to be discharged to:: Private residence Living Arrangements: Spouse/significant other Available Help at Discharge: Family;Available 24 hours/day Type of Home: House Home Access: Ramped entrance     Home Layout: One level Home Equipment: Walker - 2 wheels;Walker - 4 wheels;Wheelchair - manual Additional Comments: pt reports 2 recent falls. no injury following 1st fall. L hip fx during 2nd.    Prior Function  Level of Independence: Needs assistance         Comments: pt able to ambulate household distances with SBA following return from rehab. pt reports being IND with ADLs previous to fall on 05/18/20     Hand Dominance   Dominant Hand: Left    Extremity/Trunk Assessment                Communication   Communication: HOH  Cognition Arousal/Alertness: Awake/alert Behavior During Therapy: WFL for tasks assessed/performed Overall Cognitive Status: No family/caregiver present to determine baseline cognitive  functioning                                 General Comments: pt is alert and oreinted x4 but is a poor historian. pt does not always repond approriately to questions but may be secondary to hearing      General Comments      Exercises Total Joint Exercises Ankle Circles/Pumps: AROM;10 reps;Both Quad Sets: AROM;Both;10 reps Gluteal Sets: AROM;Both;10 reps Long Arc Quad: AROM;Both;10 reps Other Exercises Other Exercises: pt educated about the importance of mobility   Assessment/Plan    PT Assessment Patient needs continued PT services  PT Problem List         PT Treatment Interventions DME instruction;Therapeutic exercise;Gait training;Balance training;Functional mobility training;Therapeutic activities;Patient/family education    PT Goals (Current goals can be found in the Care Plan section)  Acute Rehab PT Goals Patient Stated Goal: to go home and start walking PT Goal Formulation: With patient Time For Goal Achievement: 07/02/20 Potential to Achieve Goals: Good    Frequency Min 2X/week   Barriers to discharge        Co-evaluation               AM-PAC PT "6 Clicks" Mobility  Outcome Measure Help needed turning from your back to your side while in a flat bed without using bedrails?: A Little Help needed moving from lying on your back to sitting on the side of a flat bed without using bedrails?: A Little Help needed moving to and from a bed to a chair (including a wheelchair)?: A Little Help needed standing up from a chair using your arms (e.g., wheelchair or bedside chair)?: A Lot Help needed to walk in hospital room?: A Lot Help needed climbing 3-5 steps with a railing? : A Lot 6 Click Score: 15    End of Session   Activity Tolerance: Patient limited by fatigue Patient left: in bed;with call bell/phone within reach (both rails up)   PT Visit Diagnosis: Unsteadiness on feet (R26.81);Difficulty in walking, not elsewhere classified  (R26.2);Muscle weakness (generalized) (M62.81);History of falling (Z91.81)    Time: 3009-2330 PT Time Calculation (min) (ACUTE ONLY): 29 min   Charges:              Hervey Ard, SPT 06/19/20. 5:04 PM

## 2020-06-19 NOTE — Progress Notes (Signed)
[facility] Day(s): 1.   Post op day(s):  Marland Kitchen   Interval History: Patient seen and examined, no acute events or new complaints overnight. Patient reports still having diarrhea and an upset stomach.  Denies nausea or vomiting.  Cannot identify any pain radiation.  No alleviating or aggravating factors.  Vital signs in last 24 hours: [min-max] current  Temp:  [97.7 F (36.5 C)] 97.7 F (36.5 C) (10/14 0821) Pulse Rate:  [61-97] 93 (10/14 1600) Resp:  [11-23] 22 (10/14 1630) BP: (120-170)/(51-88) 146/88 (10/14 1630) SpO2:  [90 %-98 %] 93 % (10/14 1600)     Height: 5\' 2"  (157.5 cm) Weight: 82.3 kg BMI (Calculated): 33.18   Physical Exam:  Constitutional: alert, cooperative and no distress  Respiratory: breathing non-labored at rest  Cardiovascular: regular rate and sinus rhythm  Gastrointestinal: soft, non-tender, and non-distended  Labs:  CBC Latest Ref Rng & Units 06/19/2020 06/17/2020  WBC 4.0 - 10.5 K/uL 18.8(H) 23.3(H)  Hemoglobin 12.0 - 15.0 g/dL 10.4(L) 12.0  Hematocrit 36 - 46 % 32.5(L) 37.7  Platelets 150 - 400 K/uL 324 528(H)   CMP Latest Ref Rng & Units 06/19/2020 06/17/2020  Glucose 70 - 99 mg/dL 84 100(H)  BUN 8 - 23 mg/dL 27(H) 40(H)  Creatinine 0.44 - 1.00 mg/dL 1.33(H) 1.83(H)  Sodium 135 - 145 mmol/L 134(L) 136  Potassium 3.5 - 5.1 mmol/L 3.8 3.7  Chloride 98 - 111 mmol/L 102 101  CO2 22 - 32 mmol/L 20(L) 20(L)  Calcium 8.9 - 10.3 mg/dL 8.5(L) 9.2  Total Protein 6.5 - 8.1 g/dL - 8.3(H)  Total Bilirubin 0.3 - 1.2 mg/dL - 0.8  Alkaline Phos 38 - 126 U/L - 124  AST 15 - 41 U/L - 19  ALT 0 - 44 U/L - 12    Imaging studies: I personally evaluated the MRI and discussed with radiology.  The gallbladder mass most likely malignancy until proven otherwise.  There is no pericholecystic fluid.  There is no cholelithiasis or sludge identified.  Assessment/Plan:  79 y.o. female with abnormal gallbladder ultrasound suspected/versus malignancy,  complicated by pertinent comorbidities including diarrhea and colitis.  Patient with signs and symptoms of colitis.  GI panel negative.  C. difficile negative.  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  I think that this is an incidental finding and is not the cause of the patient's symptoms.  I sent outpatient referral to hepatobiliary surgeon since the surgery will entail cholecystectomy including partial hepatectomy.  I discussed these findings with the patient.  I discussed with the patient that I will start the referral today for the biliary surgeon so when she gets medically stable and be able to be discharged from the hospital to have the referral done.  I do not recommend any surgical management at this moment.  As soon as I have any more information about the referral I will let the patient and the hospitalist know.  Arnold Long, MD

## 2020-06-19 NOTE — Progress Notes (Signed)
Patient ID: Michelle Guerrero, female   DOB: 18-Dec-1940, 79 y.o.   MRN: [id number] [facility] PROGRESS NOTE  MONTSERRATH Guerrero OIZ:[id number] DOB: 11/30/1940 DOA: 06/17/2020 PCP: Nanda Quinton, MD  HPI/Subjective: Patient feeling okay this morning.  No abdominal pain.  Patient brought in with black diarrhea.  Patient had a recent hip fracture.  Patient also has a wound on her arm from a skin cancer and radiation.  Objective: Vitals:   06/19/20 0630 06/19/20 0821  BP: (!) 137/52 (!) 164/69  Pulse: 61 82  Resp: 11 18  Temp:  97.7 F (36.5 C)  SpO2: 97% 95%   No intake or output data in the 24 hours ending 06/19/20 1316 Filed Weights   06/17/20 2153  Weight: 82.3 kg    ROS: Review of Systems  Respiratory: Negative for cough and shortness of breath.   Cardiovascular: Negative for chest pain.  Gastrointestinal: Positive for diarrhea. Negative for abdominal pain, nausea and vomiting.   Exam: Physical Exam HENT:     Head: Normocephalic.     Mouth/Throat:     Pharynx: No oropharyngeal exudate.  Eyes:     General: Lids are normal.     Conjunctiva/sclera: Conjunctivae normal.     Pupils: Pupils are equal, round, and reactive to light.  Cardiovascular:     Rate and Rhythm: Normal rate and regular rhythm.     Heart sounds: Normal heart sounds, S1 normal and S2 normal.  Pulmonary:     Breath sounds: No decreased breath sounds, wheezing, rhonchi or rales.  Abdominal:     Palpations: Abdomen is soft.     Tenderness: There is no abdominal tenderness.  Musculoskeletal:     Right lower leg: Swelling present.     Left lower leg: Swelling present.  Skin:    General: Skin is warm.     Comments: Chronic changes on nose and left cheek.  Wound on the left arm.  Neurological:     Mental Status: She is alert.     Comments: Little weaker on left straight leg raise with recent hip fracture.  But able to straight leg raise on the right leg without a problem.       Data  Reviewed: Basic Metabolic Panel: Recent Labs  Lab 06/17/20 2201 06/19/20 0343  NA 136 134*  K 3.7 3.8  CL 101 102  CO2 20* 20*  GLUCOSE 100* 84  BUN 40* 27*  CREATININE 1.83* 1.33*  CALCIUM 9.2 8.5*   Liver Function Tests: Recent Labs  Lab 06/17/20 2201  AST 19  ALT 12  ALKPHOS 124  BILITOT 0.8  PROT 8.3*  ALBUMIN 3.5   CBC: Recent Labs  Lab 06/17/20 2201 06/19/20 0343  WBC 23.3* 18.8*  HGB 12.0 10.4*  HCT 37.7 32.5*  MCV 93.5 94.2  PLT 528* 324     Recent Results (from the past 240 hour(s))  Respiratory Panel by RT PCR (Flu A&B, Covid) - Nasopharyngeal Swab     Status: None   Collection Time: 06/18/20  2:44 AM   Specimen: Nasopharyngeal Swab  Result Value Ref Range Status   SARS Coronavirus 2 by RT PCR NEGATIVE NEGATIVE Final    Comment: (NOTE) SARS-CoV-2 target nucleic acids are NOT DETECTED.  The SARS-CoV-2 RNA is generally detectable in upper respiratoy specimens during the acute phase of infection. The lowest concentration of SARS-CoV-2 viral copies this assay can detect is 131 copies/mL. A negative result does not preclude SARS-Cov-2 infection and should not be  used as the sole basis for treatment or other patient management decisions. A negative result may occur with  improper specimen collection/handling, submission of specimen other than nasopharyngeal swab, presence of viral mutation(s) within the areas targeted by this assay, and inadequate number of viral copies (<131 copies/mL). A negative result must be combined with clinical observations, patient history, and epidemiological information. The expected result is Negative.  Fact Sheet for Patients:  PinkCheek.be  Fact Sheet for Healthcare Providers:  GravelBags.it  This test is no t yet approved or cleared by the [place] FDA and  has been authorized for detection and/or diagnosis of SARS-CoV-2 by FDA under an Emergency Use  Authorization (EUA). This EUA will remain  in effect (meaning this test can be used) for the duration of the COVID-19 declaration under Section 564(b)(1) of the Act, 21 U.S.C. section 360bbb-3(b)(1), unless the authorization is terminated or revoked sooner.     Influenza A by PCR NEGATIVE NEGATIVE Final   Influenza B by PCR NEGATIVE NEGATIVE Final    Comment: (NOTE) The Xpert Xpress SARS-CoV-2/FLU/RSV assay is intended as an aid in  the diagnosis of influenza from Nasopharyngeal swab specimens and  should not be used as a sole basis for treatment. Nasal washings and  aspirates are unacceptable for Xpert Xpress SARS-CoV-2/FLU/RSV  testing.  Fact Sheet for Patients: PinkCheek.be  Fact Sheet for Healthcare Providers: GravelBags.it  This test is not yet approved or cleared by the [place] FDA and  has been authorized for detection and/or diagnosis of SARS-CoV-2 by  FDA under an Emergency Use Authorization (EUA). This EUA will remain  in effect (meaning this test can be used) for the duration of the  Covid-19 declaration under Section 564(b)(1) of the Act, 21  U.S.C. section 360bbb-3(b)(1), unless the authorization is  terminated or revoked. Performed at [facility], [address] [place], [place] [address]   Gastrointestinal Panel by PCR , Stool     Status: None   Collection Time: 06/19/20 10:20 AM   Specimen: Stool  Result Value Ref Range Status   Campylobacter species NOT DETECTED NOT DETECTED Final   Plesimonas shigelloides NOT DETECTED NOT DETECTED Final   Salmonella species NOT DETECTED NOT DETECTED Final   Yersinia enterocolitica NOT DETECTED NOT DETECTED Final   Vibrio species NOT DETECTED NOT DETECTED Final   Vibrio cholerae NOT DETECTED NOT DETECTED Final   Enteroaggregative E coli (EAEC) NOT DETECTED NOT DETECTED Final   Enteropathogenic E coli (EPEC) NOT DETECTED NOT DETECTED Final    Enterotoxigenic E coli (ETEC) NOT DETECTED NOT DETECTED Final   Shiga like toxin producing E coli (STEC) NOT DETECTED NOT DETECTED Final   Shigella/Enteroinvasive E coli (EIEC) NOT DETECTED NOT DETECTED Final   Cryptosporidium NOT DETECTED NOT DETECTED Final   Cyclospora cayetanensis NOT DETECTED NOT DETECTED Final   Entamoeba histolytica NOT DETECTED NOT DETECTED Final   Giardia lamblia NOT DETECTED NOT DETECTED Final   Adenovirus F40/41 NOT DETECTED NOT DETECTED Final   Astrovirus NOT DETECTED NOT DETECTED Final   Norovirus GI/GII NOT DETECTED NOT DETECTED Final   Rotavirus A NOT DETECTED NOT DETECTED Final   Sapovirus (I, II, IV, and V) NOT DETECTED NOT DETECTED Final    Comment: Performed at [facility], [address] [place], [place] [address]     Studies: CT ABDOMEN PELVIS WO CONTRAST  Result Date: 06/18/2020 CLINICAL DATA:  Emesis and diarrhea, recent hip fracture EXAM: CT ABDOMEN AND PELVIS WITHOUT CONTRAST TECHNIQUE: Multidetector CT  imaging of the abdomen and pelvis was performed following the standard protocol without IV contrast. COMPARISON:  None. FINDINGS: Lower chest: Motion degraded imaging of the lung bases. Some dependent atelectasis posteriorly. Normal heart size. No pericardial effusion. Coronary artery calcifications are present. Hepatobiliary: Few punctate calcifications within the liver may reflect sequela of prior granulomatous disease. No focal concerning liver lesion on this unenhanced CT. Smooth liver surface contour. Normal liver attenuation. There is nodular attenuation along the gallbladder wall which could reflect adherent biliary sludge or discrete thickening of the gallbladder wall itself. Mild surrounding pericholecystic inflammation is noted as well. No visible calcified gallstone. No biliary ductal dilatation. Pancreas: Partial fatty replacement of the pancreas. No pancreatic ductal dilatation or surrounding inflammatory changes. Spleen: Normal in  size. No concerning splenic lesions. Adrenals/Urinary Tract: Normal adrenal glands. No visible concerning renal lesion. Moderate bilateral perinephric stranding is nonspecific. No urolithiasis or hydronephrosis. Urinary bladder is largely decompressed at the time of exam and therefore poorly evaluated by CT imaging. Circumferential bladder wall thickening is noted which is nonspecific given underdistention though with perivesicular hazy stranding does raise concern for possible cystitis. Stomach/Bowel: Distal esophagus and stomach are unremarkable. No small bowel thickening or dilatation. The appendix is surgically absent. Long segmental thickening of the colon pericolonic hazy stranding is seen from the mid transverse to the distal sigmoid which does not appear focally centered upon a culprit diverticulum despite the presence of numerous distal colonic diverticula. No evidence of bowel obstruction. Vascular/Lymphatic: Atherosclerotic calcifications within the abdominal aorta and branch vessels. No aneurysm or ectasia. No enlarged abdominopelvic lymph nodes. Reproductive: Normal anteverted uterus. Parametrial calcifications are in often senescent finding. Quiescent appearance of the ovarian tissue. Other: Postsurgical changes seen along the lateral left hip with a large hypoattenuating collection extending to the skin surface measuring approximately 6.7 x 3 x 4.6 cm in size which could reflect a postoperative hematoma or seroma. Infection is not fully excluded though no soft tissue gas is evident. Musculoskeletal: Post open reduction internal fixation of a intertrochanteric left femur fracture with placement of an intramedullary nail and transcervical fixation pin. No acute hardware fracture or failure is clearly evident accounting for slight motion artifact and streak. Few displaced fracture fragments including a retracted fragment comprising the lesser trochanter are present. No other acute fracture or osseous  abnormality of the abdomen or pelvis. Levocurvature of the lumbar spine apex L3. Multilevel degenerative changes are present in the imaged portions of the spine. Additional degenerative changes in the bilateral hips. IMPRESSION: 1. Long segmental thickening of the colon pericolonic hazy stranding from the splenic flexure to the distal sigmoid which does not appear focally centered upon a culprit diverticulum despite the presence of numerous distal colonic diverticula. Findings are suggestive of a segmental colitis which could include infectious, inflammatory or vascular etiologies the latter of which would likely involve the IMA distribution. 2. Nodular attenuation along the gallbladder wall which could reflect adherent tumefactive biliary sludge versus discrete thickening of the gallbladder wall itself. Mild surrounding pericholecystic inflammation is noted as well. No visible calcified gallstone. Consider further evaluation with right upper quadrant ultrasound. 3. Circumferential bladder wall thickening with perivesicular hazy stranding concerning for cystitis. Additional nonspecific bilateral perinephric stranding, cannot exclude ascending tract infection. Correlate with urinalysis. 4. Post open reduction internal fixation of a intertrochanteric left femur fracture with a large hypoattenuating collection extending to the skin surface measuring approximately 6.7 x 3 x 4.6 cm in size which likely reflects a postoperative hematoma or seroma. Infection  is less favored though cannot be fully excluded on an imaging basis. 5. Aortic Atherosclerosis (ICD10-I70.0). Electronically Signed   By: Lovena Le M.D.   On: 06/18/2020 00:07   MR ABDOMEN W WO CONTRAST  Result Date: 06/18/2020 CLINICAL DATA:  Indeterminate nodular foci along the gallbladder wall on CT and ultrasound. EXAM: MRI ABDOMEN WITHOUT AND WITH CONTRAST TECHNIQUE: Multiplanar multisequence MR imaging of the abdomen was performed both before and after  the administration of intravenous contrast. CONTRAST:  7.66mL GADAVIST GADOBUTROL 1 MMOL/ML IV SOLN COMPARISON:  06/17/2020 CT abdomen/pelvis. 06/18/2020 abdominal sonogram. FINDINGS: Substantially motion degraded scan, limiting assessment. Lower chest: No acute abnormality at the lung bases. Hepatobiliary: Normal liver size and configuration. Diffuse hepatic hemosiderosis. No liver mass. There are multifocal irregular masslike foci of wall thickening throughout the gallbladder wall with associated avid enhancement, measuring 4.5 x 1.0 cm posteriorly (series 3/image 18) and 2.2 x 1.3 cm anteriorly (series 3/image 17). These gallbladder masses appear confined to the gallbladder with no appreciable liver invasion. No pericholecystic fluid. No cholelithiasis. No biliary ductal dilatation. Common bile duct diameter 5 mm. No choledocholithiasis. No bile duct masses, strictures or beading. Pancreas: No pancreatic mass or duct dilation.  No pancreas divisum. Spleen: Normal size spleen. No splenic mass. Diffuse splenic hemosiderosis. Adrenals/Urinary Tract: Normal adrenals. No hydronephrosis. Normal kidneys with no renal mass. Stomach/Bowel: Normal non-distended stomach. Visualized small and large bowel is normal caliber, with no bowel wall thickening. Vascular/Lymphatic: Atherosclerotic nonaneurysmal abdominal aorta. Patent portal, splenic, hepatic and renal veins. No pathologically enlarged lymph nodes in the abdomen. Other: No abdominal ascites or focal fluid collection. Musculoskeletal: No aggressive appearing focal osseous lesions. IMPRESSION: 1. Avidly enhancing multifocal irregular masslike foci of wall thickening throughout the gallbladder, measuring 4.5 x 1.0 cm posteriorly and 2.2 x 1.3 cm anteriorly. 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 These gallbladder masses appear confined to the gallbladder with no appreciable liver invasion. Surgical consultation advised. 2. No biliary ductal dilatation. No  cholelithiasis or choledocholithiasis. 3. Diffuse hepatic and splenic hemosiderosis. Electronically Signed   By: Ilona Sorrel M.D.   On: 06/18/2020 18:21   US ABDOMEN LIMITED RUQ  Result Date: 06/18/2020 CLINICAL DATA:  Emesis and diarrhea, concern for acute cholecystitis on CT 06/17/2020 EXAM: ULTRASOUND ABDOMEN LIMITED RIGHT UPPER QUADRANT COMPARISON:  CT 06/17/2020 FINDINGS: Gallbladder: Non-mobile, mass-like, irregular, hyperechoic lesion along the gallbladder wall measuring 2.3 x 1.5 cm with no associated vascular flow. Gallbladder wall thickened measuring up to 6 mm. No sonographic Murphy sign noted by sonographer. Common bile duct: Diameter: 3 mm Liver: Scattered calcific densities better appreciated on CT and consistent with sequelae of prior granulomatous disease. No focal lesion identified. Within normal limits in parenchymal echogenicity. Portal vein is patent on color Doppler imaging with normal direction of blood flow towards the liver. Other: Increased right renal parenchymal echogenicity. No hydronephrosis of the right kidney. IMPRESSION: 1. Findings suggestive of tumefactive sludge. Gallbladder malignancy remains in the differential. Recommend follow-up ultrasound in 3 months or MRI/surgical consultation earlier if clinically indicated. 2. Increased right renal parenchymal echogenicity suggestive of chronic renal disease. Electronically Signed   By: Iven Finn M.D.   On: 06/18/2020 04:01    Scheduled Meds: . amLODipine  5 mg Oral Daily  . atorvastatin  20 mg Oral Daily  . cholecalciferol  1,000 Units Oral Daily  . hydrochlorothiazide  25 mg Oral Daily  . lisinopril  40 mg Oral Daily  . loratadine  10 mg Oral Daily  . pantoprazole  40 mg Oral Daily  . sodium bicarbonate  650 mg Oral BID   Continuous Infusions: . sodium chloride 50 mL/hr at 06/19/20 1016    Assessment/Plan:  1. Diarrhea.  Stool comprehensive panel negative.  Awaiting stool for C. difficile.  We will also  send a stool guaiac.  With dark stools always concern for bleeding.  Monitor hemoglobin closely.  GI also following.  Patient also has leukocytosis. 2. Abnormal MRCP of the gallbladder concerning for possible gallbladder malignancy.  Surgical follow-up.  Potentially will need gallbladder removal. 3. Essential hypertension on amlodipine, hydrochlorothiazide and lisinopril 4. GERD on Protonix 5. Hyperlipidemia unspecified on Lipitor 6. History of skin cancer with wound on nose and left arm.  We will get wound care consultation. 7. Acute kidney injury with creatinine of 1.83 on presentation and down to 1.33.  Continue IV fluid hydration.    Code Status:     Code Status Orders  (From admission, onward)         Start     Ordered   06/18/20 1021  Full code  Continuous        06/18/20 1026        Code Status History    This patient has a current code status but no historical code status.   Advance Care Planning Activity     Family Communication: Spoke with daughter on the phone given update Disposition Plan: Status is: Inpatient  Dispo: The patient is from: Rehab              Anticipated d/c is to: Rehab versus home depending on how she does with physical therapy              Anticipated d/c date is: Dependent on how her diarrhea goes and whether the surgeons want to take out the gallbladder or not.              Patient currently being followed closely for diarrhea.  Stool for C. difficile still pending.  Patient also has an abnormal gallbladder on imaging.  Consultants:  Gastroenterology  General surgery  Time spent: 28 minutes  [place]

## 2020-06-20 DIAGNOSIS — N179 Acute kidney failure, unspecified: Secondary | ICD-10-CM | POA: Diagnosis not present

## 2020-06-20 DIAGNOSIS — K828 Other specified diseases of gallbladder: Secondary | ICD-10-CM | POA: Diagnosis not present

## 2020-06-20 DIAGNOSIS — R197 Diarrhea, unspecified: Secondary | ICD-10-CM | POA: Diagnosis not present

## 2020-06-20 DIAGNOSIS — I10 Essential (primary) hypertension: Secondary | ICD-10-CM | POA: Diagnosis not present

## 2020-06-20 LAB — BASIC METABOLIC PANEL
Anion gap: 14 (ref 5–15)
BUN: 23 mg/dL (ref 8–23)
CO2: 18 mmol/L — ABNORMAL LOW (ref 22–32)
Calcium: 8.6 mg/dL — ABNORMAL LOW (ref 8.9–10.3)
Chloride: 105 mmol/L (ref 98–111)
Creatinine, Ser: 1.35 mg/dL — ABNORMAL HIGH (ref 0.44–1.00)
GFR, Estimated: 38 mL/min — ABNORMAL LOW (ref >60)
Glucose, Bld: 74 mg/dL (ref 70–99)
Potassium: 4.2 mmol/L (ref 3.5–5.1)
Sodium: 137 mmol/L (ref 135–145)

## 2020-06-20 LAB — CBC WITH DIFFERENTIAL/PLATELET
Abs Immature Granulocytes: 0.09 10*3/uL — ABNORMAL HIGH (ref 0.00–0.07)
Basophils Absolute: 0.1 10*3/uL (ref 0.0–0.1)
Basophils Relative: 1 %
Eosinophils Absolute: 0.6 10*3/uL — ABNORMAL HIGH (ref 0.0–0.5)
Eosinophils Relative: 4 %
HCT: 31.9 % — ABNORMAL LOW (ref 36.0–46.0)
Hemoglobin: 10.4 g/dL — ABNORMAL LOW (ref 12.0–15.0)
Immature Granulocytes: 1 %
Lymphocytes Relative: 18 %
Lymphs Abs: 2.9 10*3/uL (ref 0.7–4.0)
MCH: 30.4 pg (ref 26.0–34.0)
MCHC: 32.6 g/dL (ref 30.0–36.0)
MCV: 93.3 fL (ref 80.0–100.0)
Monocytes Absolute: 0.8 10*3/uL (ref 0.1–1.0)
Monocytes Relative: 5 %
Neutro Abs: 11.4 10*3/uL — ABNORMAL HIGH (ref 1.7–7.7)
Neutrophils Relative %: 71 %
Platelets: 367 10*3/uL (ref 150–400)
RBC: 3.42 MIL/uL — ABNORMAL LOW (ref 3.87–5.11)
RDW: 14.9 % (ref 11.5–15.5)
WBC: 15.9 10*3/uL — ABNORMAL HIGH (ref 4.0–10.5)
nRBC: 0 % (ref 0.0–0.2)

## 2020-06-20 MED ORDER — ENOXAPARIN SODIUM 40 MG/0.4ML ~~LOC~~ SOLN
0.5000 mg/kg | SUBCUTANEOUS | Status: DC
  Administered 2020-06-20 – 2020-07-03 (×14): 40 mg via SUBCUTANEOUS
  Filled 2020-06-20 (×14): qty 0.4

## 2020-06-20 MED ORDER — SODIUM CHLORIDE 0.9 % IV SOLN
INTRAVENOUS | Status: DC | PRN
  Administered 2020-06-20 – 2020-06-26 (×4): 250 mL via INTRAVENOUS

## 2020-06-20 NOTE — Progress Notes (Signed)
[facility] Hospital Day(s): 2.   Post op day(s):  Marland Kitchen   Interval History: Patient seen and examined, no acute events or new complaints overnight. Patient reports still having diarrhea but improving.  Denies abdominal pain.  No pain radiation.  No alleviating or rating factor.  Patient tolerating diet.  Vital signs in last 24 hours: [min-max] current  Temp:  [97.5 F (36.4 C)-98.6 F (37 C)] 98 F (36.7 C) (10/15 0818) Pulse Rate:  [83-93] 91 (10/15 0818) Resp:  [16-22] 16 (10/15 0818) BP: (105-151)/(46-88) 107/47 (10/15 0818) SpO2:  [93 %-100 %] 97 % (10/15 0818)     Height: 5\' 2"  (157.5 cm) Weight: 82.3 kg BMI (Calculated): 33.18   Physical Exam:  Constitutional: alert, cooperative and no distress  Respiratory: breathing non-labored at rest  Cardiovascular: regular rate and sinus rhythm  Gastrointestinal: soft, non-tender, and non-distended  Labs:  CBC Latest Ref Rng & Units 06/20/2020 06/19/2020 06/17/2020  WBC 4.0 - 10.5 K/uL 15.9(H) 18.8(H) 23.3(H)  Hemoglobin 12.0 - 15.0 g/dL 10.4(L) 10.4(L) 12.0  Hematocrit 36 - 46 % 31.9(L) 32.5(L) 37.7  Platelets 150 - 400 K/uL 367 324 528(H)   CMP Latest Ref Rng & Units 06/20/2020 06/19/2020 06/17/2020  Glucose 70 - 99 mg/dL 74 84 100(H)  BUN 8 - 23 mg/dL 23 27(H) 40(H)  Creatinine 0.44 - 1.00 mg/dL 1.35(H) 1.33(H) 1.83(H)  Sodium 135 - 145 mmol/L 137 134(L) 136  Potassium 3.5 - 5.1 mmol/L 4.2 3.8 3.7  Chloride 98 - 111 mmol/L 105 102 101  CO2 22 - 32 mmol/L 18(L) 20(L) 20(L)  Calcium 8.9 - 10.3 mg/dL 8.6(L) 8.5(L) 9.2  Total Protein 6.5 - 8.1 g/dL - - 8.3(H)  Total Bilirubin 0.3 - 1.2 mg/dL - - 0.8  Alkaline Phos 38 - 126 U/L - - 124  AST 15 - 41 U/L - - 19  ALT 0 - 44 U/L - - 12    Imaging studies: No new pertinent imaging studies   Assessment/Plan:  79 y.o.femalewith gallbladder mass, complicated by pertinent comorbidities includingdiarrhea and colitis.  Patient with gallbladder mass, malignant  improvement otherwise.  No cholelithiasis.  I think that this is an incidental finding and not the cause of her diarrhea.  Surgery for malignancy of the gallbladder includes partial hepatectomy which is need to be done by a hepatobiliary surgeon.  However cell continue to trend down.  Patient also tolerating diet and diarrhea improving.  From general surgery standpoint when medically stable patient can be discharged.  I placed outpatient referral to hepatobiliary surgeon hoping that when she is discharged she will have the appointment made to be able to be seen soon as possible.  No surgical management indicated at this moment.   Arnold Long, MD

## 2020-06-20 NOTE — Progress Notes (Signed)
Michelle Guerrero ID: ALFREDA HAMMAD, female   DOB: 04-12-1941, 79 y.o.   MRN: [id number] [facility] PROGRESS NOTE  Michelle Guerrero XBJ:[id number] DOB: 07/23/1941 DOA: 06/17/2020 PCP: Nanda Quinton, MD  HPI/Subjective: Michelle Guerrero seen this morning and was having diarrhea. Michelle Guerrero had 2 episodes of diarrhea yesterday. Feels a little bit better than when Michelle Guerrero came in. No nausea or vomiting. Diet advanced today.  Objective: Vitals:   06/20/20 0558 06/20/20 0818  BP: (!) 116/48 (!) 107/47  Pulse: 85 91  Resp: 17 16  Temp: (!) 97.5 F (36.4 C) 98 F (36.7 C)  SpO2: 96% 97%    Intake/Output Summary (Last 24 hours) at 06/20/2020 1205 Last data filed at 06/19/2020 1730 Gross per 24 hour  Intake 0 ml  Output 800 ml  Net -800 ml   Filed Weights   06/17/20 2153  Weight: 82.3 kg    ROS: Review of Systems  Respiratory: Negative for shortness of breath.   Cardiovascular: Negative for chest pain.  Gastrointestinal: Positive for diarrhea. Negative for abdominal pain, nausea and vomiting.   Exam: Physical Exam HENT:     Head: Normocephalic.     Mouth/Throat:     Pharynx: No oropharyngeal exudate.  Eyes:     General: Lids are normal.     Conjunctiva/sclera: Conjunctivae normal.     Pupils: Pupils are equal, round, and reactive to light.  Cardiovascular:     Rate and Rhythm: Normal rate and regular rhythm.     Heart sounds: Normal heart sounds, S1 normal and S2 normal.  Pulmonary:     Breath sounds: No decreased breath sounds, wheezing, rhonchi or rales.  Abdominal:     Palpations: Abdomen is soft.     Tenderness: There is no abdominal tenderness.  Musculoskeletal:     Right ankle: No swelling.     Left ankle: No swelling.  Skin:    General: Skin is warm.     Comments: Left arm covered Nose covered with Band-Aid.  Neurological:     Mental Status: Michelle Guerrero is alert and oriented to person, place, and time.       Data Reviewed: Basic Metabolic Panel: Recent Labs  Lab 06/17/20 2201  06/19/20 0343 06/20/20 0503  NA 136 134* 137  K 3.7 3.8 4.2  CL 101 102 105  CO2 20* 20* 18*  GLUCOSE 100* 84 74  BUN 40* 27* 23  CREATININE 1.83* 1.33* 1.35*  CALCIUM 9.2 8.5* 8.6*   Liver Function Tests: Recent Labs  Lab 06/17/20 2201  AST 19  ALT 12  ALKPHOS 124  BILITOT 0.8  PROT 8.3*  ALBUMIN 3.5   CBC: Recent Labs  Lab 06/17/20 2201 06/19/20 0343 06/20/20 0503  WBC 23.3* 18.8* 15.9*  NEUTROABS  --   --  11.4*  HGB 12.0 10.4* 10.4*  HCT 37.7 32.5* 31.9*  MCV 93.5 94.2 93.3  PLT 528* 324 367     Recent Results (from the past 240 hour(s))  Respiratory Panel by RT PCR (Flu A&B, Covid) - Nasopharyngeal Swab     Status: None   Collection Time: 06/18/20  2:44 AM   Specimen: Nasopharyngeal Swab  Result Value Ref Range Status   SARS Coronavirus 2 by RT PCR NEGATIVE NEGATIVE Final    Comment: (NOTE) SARS-CoV-2 target nucleic acids are NOT DETECTED.  The SARS-CoV-2 RNA is generally detectable in upper respiratoy specimens during the acute phase of infection. The lowest concentration of SARS-CoV-2 viral copies this assay can detect is 131 copies/mL.  A negative result does not preclude SARS-Cov-2 infection and should not be used as the sole basis for treatment or other Michelle Guerrero management decisions. A negative result may occur with  improper specimen collection/handling, submission of specimen other than nasopharyngeal swab, presence of viral mutation(s) within the areas targeted by this assay, and inadequate number of viral copies (<131 copies/mL). A negative result must be combined with clinical observations, Michelle Guerrero history, and epidemiological information. The expected result is Negative.  Fact Sheet for Patients:  PinkCheek.be  Fact Sheet for Healthcare Providers:  GravelBags.it  This test is no t yet approved or cleared by the [place] FDA and  has been authorized for detection and/or  diagnosis of SARS-CoV-2 by FDA under an Emergency Use Authorization (EUA). This EUA will remain  in effect (meaning this test can be used) for the duration of the COVID-19 declaration under Section 564(b)(1) of the Act, 21 U.S.C. section 360bbb-3(b)(1), unless the authorization is terminated or revoked sooner.     Influenza A by PCR NEGATIVE NEGATIVE Final   Influenza B by PCR NEGATIVE NEGATIVE Final    Comment: (NOTE) The Xpert Xpress SARS-CoV-2/FLU/RSV assay is intended as an aid in  the diagnosis of influenza from Nasopharyngeal swab specimens and  should not be used as a sole basis for treatment. Nasal washings and  aspirates are unacceptable for Xpert Xpress SARS-CoV-2/FLU/RSV  testing.  Fact Sheet for Patients: PinkCheek.be  Fact Sheet for Healthcare Providers: GravelBags.it  This test is not yet approved or cleared by the [place] FDA and  has been authorized for detection and/or diagnosis of SARS-CoV-2 by  FDA under an Emergency Use Authorization (EUA). This EUA will remain  in effect (meaning this test can be used) for the duration of the  Covid-19 declaration under Section 564(b)(1) of the Act, 21  U.S.C. section 360bbb-3(b)(1), unless the authorization is  terminated or revoked. Performed at [facility], [address] [place], [place] [address]   Gastrointestinal Panel by PCR , Stool     Status: None   Collection Time: 06/19/20 10:20 AM   Specimen: Stool  Result Value Ref Range Status   Campylobacter species NOT DETECTED NOT DETECTED Final   Plesimonas shigelloides NOT DETECTED NOT DETECTED Final   Salmonella species NOT DETECTED NOT DETECTED Final   Yersinia enterocolitica NOT DETECTED NOT DETECTED Final   Vibrio species NOT DETECTED NOT DETECTED Final   Vibrio cholerae NOT DETECTED NOT DETECTED Final   Enteroaggregative E coli (EAEC) NOT DETECTED NOT DETECTED Final   Enteropathogenic E  coli (EPEC) NOT DETECTED NOT DETECTED Final   Enterotoxigenic E coli (ETEC) NOT DETECTED NOT DETECTED Final   Shiga like toxin producing E coli (STEC) NOT DETECTED NOT DETECTED Final   Shigella/Enteroinvasive E coli (EIEC) NOT DETECTED NOT DETECTED Final   Cryptosporidium NOT DETECTED NOT DETECTED Final   Cyclospora cayetanensis NOT DETECTED NOT DETECTED Final   Entamoeba histolytica NOT DETECTED NOT DETECTED Final   Giardia lamblia NOT DETECTED NOT DETECTED Final   Adenovirus F40/41 NOT DETECTED NOT DETECTED Final   Astrovirus NOT DETECTED NOT DETECTED Final   Norovirus GI/GII NOT DETECTED NOT DETECTED Final   Rotavirus A NOT DETECTED NOT DETECTED Final   Sapovirus (I, II, IV, and V) NOT DETECTED NOT DETECTED Final    Comment: Performed at [facility], [address], [place], [place] [address]  C Difficile Quick Screen w PCR reflex     Status: None   Collection Time: 06/19/20 10:20 AM  Specimen: STOOL  Result Value Ref Range Status   C Diff antigen NEGATIVE NEGATIVE Final   C Diff toxin NEGATIVE NEGATIVE Final   C Diff interpretation No C. difficile detected.  Final    Comment: Performed at [facility], [address] [place], [place] [address]     Studies: MR ABDOMEN W WO CONTRAST  Result Date: 06/18/2020 CLINICAL DATA:  Indeterminate nodular foci along the gallbladder wall on CT and ultrasound. EXAM: MRI ABDOMEN WITHOUT AND WITH CONTRAST TECHNIQUE: Multiplanar multisequence MR imaging of the abdomen was performed both before and after the administration of intravenous contrast. CONTRAST:  7.4mL GADAVIST GADOBUTROL 1 MMOL/ML IV SOLN COMPARISON:  06/17/2020 CT abdomen/pelvis. 06/18/2020 abdominal sonogram. FINDINGS: Substantially motion degraded scan, limiting assessment. Lower chest: No acute abnormality at the lung bases. Hepatobiliary: Normal liver size and configuration. Diffuse hepatic hemosiderosis. No liver mass. There are multifocal irregular masslike  foci of wall thickening throughout the gallbladder wall with associated avid enhancement, measuring 4.5 x 1.0 cm posteriorly (series 3/image 18) and 2.2 x 1.3 cm anteriorly (series 3/image 17). These gallbladder masses appear confined to the gallbladder with no appreciable liver invasion. No pericholecystic fluid. No cholelithiasis. No biliary ductal dilatation. Common bile duct diameter 5 mm. No choledocholithiasis. No bile duct masses, strictures or beading. Pancreas: No pancreatic mass or duct dilation.  No pancreas divisum. Spleen: Normal size spleen. No splenic mass. Diffuse splenic hemosiderosis. Adrenals/Urinary Tract: Normal adrenals. No hydronephrosis. Normal kidneys with no renal mass. Stomach/Bowel: Normal non-distended stomach. Visualized small and large bowel is normal caliber, with no bowel wall thickening. Vascular/Lymphatic: Atherosclerotic nonaneurysmal abdominal aorta. Patent portal, splenic, hepatic and renal veins. No pathologically enlarged lymph nodes in the abdomen. Other: No abdominal ascites or focal fluid collection. Musculoskeletal: No aggressive appearing focal osseous lesions. IMPRESSION: 1. Avidly enhancing multifocal irregular masslike foci of wall thickening throughout the gallbladder, measuring 4.5 x 1.0 cm posteriorly and 2.2 x 1.3 cm anteriorly. 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 These gallbladder masses appear confined to the gallbladder with no appreciable liver invasion. Surgical consultation advised. 2. No biliary ductal dilatation. No cholelithiasis or choledocholithiasis. 3. Diffuse hepatic and splenic hemosiderosis. Electronically Signed   By: Ilona Sorrel M.D.   On: 06/18/2020 18:21    Scheduled Meds: . amLODipine  5 mg Oral Daily  . atorvastatin  20 mg Oral Daily  . cholecalciferol  1,000 Units Oral Daily  . ciprofloxacin  250 mg Oral BID  . hydrochlorothiazide  25 mg Oral Daily  . lisinopril  40 mg Oral Daily  . loratadine  10 mg Oral Daily  .  pantoprazole  40 mg Oral Daily  . sodium bicarbonate  650 mg Oral BID   Continuous Infusions: . sodium chloride 40 mL/hr at 06/19/20 1330  . metronidazole 500 mg (06/20/20 0453)    Assessment/Plan:  1. Diarrhea. Stool comprehensive panel and C. difficile are negative. Asked for stool guaiac to be sent. Hemoglobin stable so I do not think there is bleeding. Started empiric antibiotics of Cipro and Flagyl secondary to leukocytosis. White blood cell count starting to trend down. 2. Abnormal MRCP of the gallbladder. 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 Case discussed with general surgery and they do not want to do a surgery here and he put out an outpatient referral to Dr. Stark Klein. Case discussed with daughter and they will try to set up outpatient appointment for this. 3. Essential hypertension on amlodipine, hydrochlorothiazide and lisinopril 4. GERD on Protonix 5. Hyperlipidemia unspecified  on Lipitor 6. 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 Appreciate wound care consultation 7. Acute kidney injury on chronic kidney disease stage IIIb. Since creatinine leveled out at 1.35 I will hold off on further IV fluids.     Code Status:     Code Status Orders  (From admission, onward)         Start     Ordered   06/18/20 1021  Full code  Continuous        06/18/20 1026        Code Status History    This Michelle Guerrero has a current code status but no historical code status.   Advance Care Planning Activity     Family Communication: Spoke with Michelle Guerrero's daughter on the phone Disposition Plan: Status is: Inpatient  Dispo: The Michelle Guerrero is from: Home              Anticipated d/c is to: Home with home health versus rehab              Anticipated d/c date is: We will evaluate on a day-to-day basis on how much diarrhea Michelle Guerrero is having.              Michelle Guerrero currently being followed closely for diarrhea. Michelle Guerrero did not want to take Imodium. Continue to watch bowel  movements.  Consultants:  General surgery  Gastroenterology  Antibiotics:  Cipro  Flagyl  Time spent: 27 minutes  [place]

## 2020-06-20 NOTE — Progress Notes (Signed)
Physical Therapy Treatment Patient Details Name: Michelle Guerrero MRN: [id number] DOB: [birth year] Today's Date: 06/20/2020    History of Present Illness Michelle Guerrero is a 79 y.o. 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 MD assessment includes acute collitis and abnormal gallbladder ultrasound.    PT Comments    Patient received in bed sleeping, rouses to my voice. She declines sitting up on side of bed or ambulation this visit. Despite encouragement patient continues to decline. She agrees to bed exercises only. Performed exercises with min assist on left LE due to pain in left LE.  She will continue to benefit from skilled PT while here to improve functional mobility and strength. Patient states she wants to go home and I told her she would have to demonstrate the ability to at least get up and into recliner. She seems to understand this, but does not want to do that at this time. Will continue to follow to improve strength and functional mobility.       Follow Up Recommendations  SNF     Equipment Recommendations  None recommended by PT;Other (comment) (TBD)    Recommendations for Other Services       Precautions / Restrictions Precautions Precaution Comments: L Hip s/p ORIF approx. 3 weeks ago Restrictions Weight Bearing Restrictions: No RLE Weight Bearing: Weight bearing as tolerated    Mobility  Bed Mobility               General bed mobility comments: patient declines sitting up on side of bed, or getting oob today. Despite encouragement, she continues to decline  Transfers                 General transfer comment: pt declined secondary to feeling weak and sick  Ambulation/Gait             General Gait  Details: pt declined secondary to feeling weak and sick   Stairs             Wheelchair Mobility    Modified Rankin (Stroke Patients Only)       Balance                                            Cognition Arousal/Alertness: Awake/alert Behavior During Therapy: WFL for tasks assessed/performed Overall Cognitive Status: No family/caregiver present to determine baseline cognitive functioning                                        Exercises Other Exercises Other Exercises: pt educated about the importance of mobility Other Exercises: B LE exercises: ap, heel slides, SLR, hip abd/add, x 10 reps each with min guard assist on left.    General Comments        Pertinent Vitals/Pain Pain Assessment: Faces Faces Pain Scale: Hurts a little bit Pain Location: L leg Pain Descriptors / Indicators: Sore;Aching Pain Intervention(s): Monitored during session;Repositioned;Limited activity within patient's tolerance    Home Living  Prior Function            PT Goals (current goals can now be found in the care plan section) Acute Rehab PT Goals Patient Stated Goal: to go home and start walking PT Goal Formulation: With patient Time For Goal Achievement: 07/02/20 Potential to Achieve Goals: Fair Progress towards PT goals: Not progressing toward goals - comment (patient declining oob activities)    Frequency    Min 2X/week      PT Plan Current plan remains appropriate    Co-evaluation              AM-PAC PT "6 Clicks" Mobility   Outcome Measure  Help needed turning from your back to your side while in a flat bed without using bedrails?: A Lot Help needed moving from lying on your back to sitting on the side of a flat bed without using bedrails?: A Lot Help needed moving to and from a bed to a chair (including a wheelchair)?: A Lot Help needed standing up from a chair using your arms (e.g.,  wheelchair or bedside chair)?: A Lot Help needed to walk in hospital room?: A Lot Help needed climbing 3-5 steps with a railing? : A Lot 6 Click Score: 12    End of Session   Activity Tolerance: Patient limited by fatigue Patient left: in bed;with call bell/phone within reach;with bed alarm set Nurse Communication: Mobility status PT Visit Diagnosis: Muscle weakness (generalized) (M62.81);Other abnormalities of gait and mobility (R26.89);History of falling (Z91.81)     Time: 0093-8182 PT Time Calculation (min) (ACUTE ONLY): 10 min  Charges:  $Therapeutic Exercise: 8-22 mins                     Mysty Kielty, PT, GCS 06/20/20,11:48 AM

## 2020-06-21 ENCOUNTER — Inpatient Hospital Stay: Payer: Medicare HMO

## 2020-06-21 DIAGNOSIS — I10 Essential (primary) hypertension: Secondary | ICD-10-CM | POA: Diagnosis not present

## 2020-06-21 DIAGNOSIS — K828 Other specified diseases of gallbladder: Secondary | ICD-10-CM | POA: Diagnosis not present

## 2020-06-21 DIAGNOSIS — M25562 Pain in left knee: Secondary | ICD-10-CM

## 2020-06-21 DIAGNOSIS — K219 Gastro-esophageal reflux disease without esophagitis: Secondary | ICD-10-CM | POA: Diagnosis not present

## 2020-06-21 DIAGNOSIS — R197 Diarrhea, unspecified: Secondary | ICD-10-CM | POA: Diagnosis not present

## 2020-06-21 LAB — CBC
HCT: 31.6 % — ABNORMAL LOW (ref 36.0–46.0)
Hemoglobin: 10.4 g/dL — ABNORMAL LOW (ref 12.0–15.0)
MCH: 29.9 pg (ref 26.0–34.0)
MCHC: 32.9 g/dL (ref 30.0–36.0)
MCV: 90.8 fL (ref 80.0–100.0)
Platelets: 362 10*3/uL (ref 150–400)
RBC: 3.48 MIL/uL — ABNORMAL LOW (ref 3.87–5.11)
RDW: 14.7 % (ref 11.5–15.5)
WBC: 13.1 10*3/uL — ABNORMAL HIGH (ref 4.0–10.5)
nRBC: 0 % (ref 0.0–0.2)

## 2020-06-21 LAB — BASIC METABOLIC PANEL
Anion gap: 11 (ref 5–15)
BUN: 25 mg/dL — ABNORMAL HIGH (ref 8–23)
CO2: 21 mmol/L — ABNORMAL LOW (ref 22–32)
Calcium: 8.6 mg/dL — ABNORMAL LOW (ref 8.9–10.3)
Chloride: 102 mmol/L (ref 98–111)
Creatinine, Ser: 1.32 mg/dL — ABNORMAL HIGH (ref 0.44–1.00)
GFR, Estimated: 39 mL/min — ABNORMAL LOW (ref >60)
Glucose, Bld: 100 mg/dL — ABNORMAL HIGH (ref 70–99)
Potassium: 4 mmol/L (ref 3.5–5.1)
Sodium: 134 mmol/L — ABNORMAL LOW (ref 135–145)

## 2020-06-21 IMAGING — CR DG KNEE 1-2V*L*
1 series · 2 of 2 positions shown · non-contrast
Comparison: None.

CLINICAL DATA: Fall 3 weeks ago.  Left knee pain.

EXAM:
LEFT KNEE - 1-2 VIEW

[Series 1: dg knee 1-2 views left · 0.14mm/px · 2 of 2 slices shown]
[im 1/2]
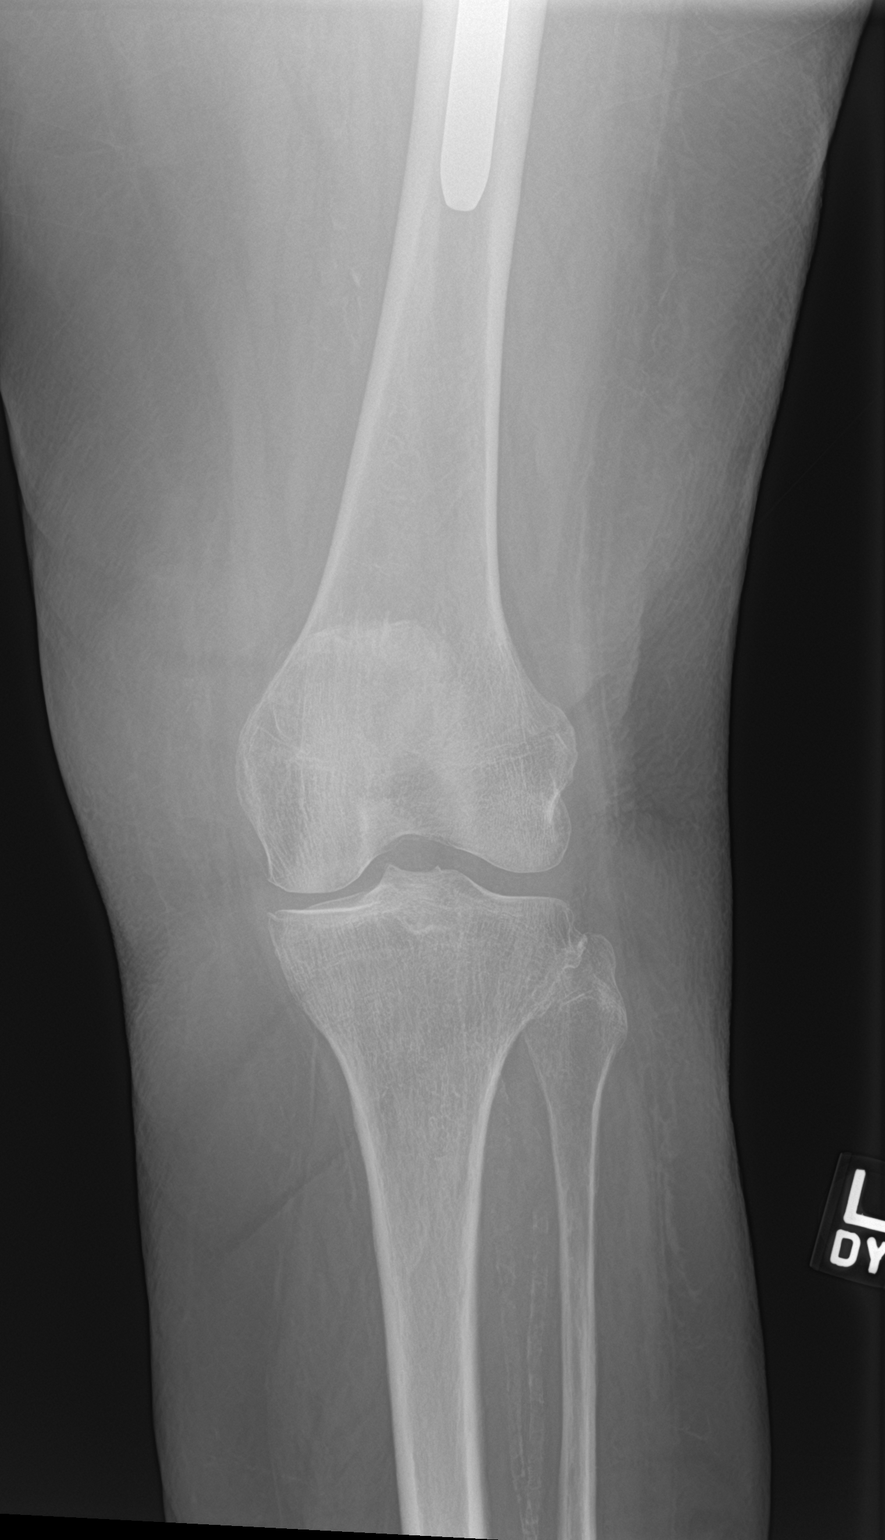
[im 2/2]
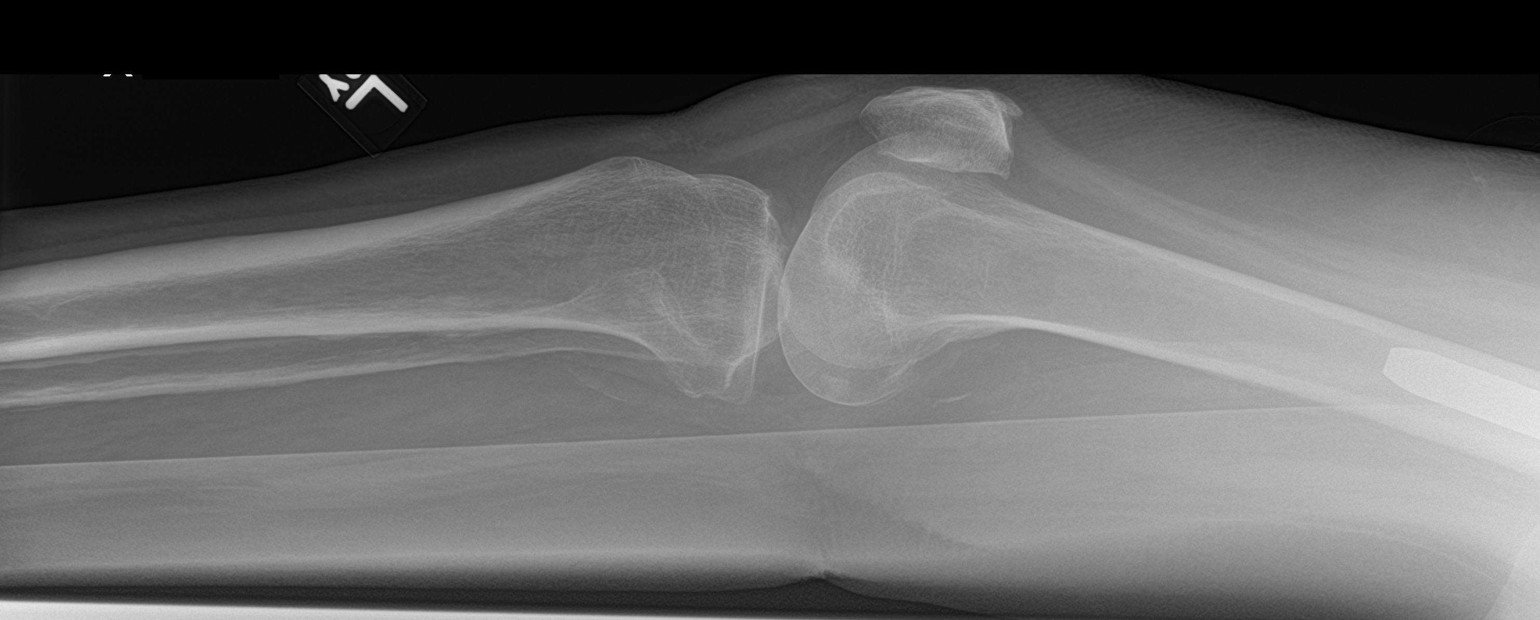

[2 of 2 positions shown; findings below may reference images not displayed]

FINDINGS: No fracture. Knee joint is normally spaced and aligned. No
significant arthropathic/degenerative changes. No joint effusion.

Scattered posterior arterial vascular calcifications. The inferior
margin of an intramedullary rod is noted in the distal femoral
shaft, well seated and centrally positioned.
IMPRESSION: 1. No fracture.  No joint abnormality.

## 2020-06-21 IMAGING — US US EXTREM LOW VENOUS*L*
1 series · 13 of 24 positions shown · non-contrast
Comparison: None.

CLINICAL DATA: 79-year-old female with leg pain.

EXAM:
LEFT LOWER EXTREMITY VENOUS DOPPLER ULTRASOUND
TECHNIQUE: Gray-scale sonography with graded compression, as well as color
Doppler and duplex ultrasound were performed to evaluate the left
lower extremity deep venous systems from the level of the common
femoral vein and including the common femoral, femoral, profunda
femoral, popliteal and calf veins including the posterior tibial,
peroneal and gastrocnemius veins when visible. Spectral Doppler was
utilized to evaluate flow at rest and with distal augmentation
maneuvers in the common femoral, femoral and popliteal veins. The
contralateral common femoral vein was also evaluated for comparison.

[Series 1: us venous img lower uni left (dvt) · portal-venous · 13 of 34 slices shown]
[im 1/34]
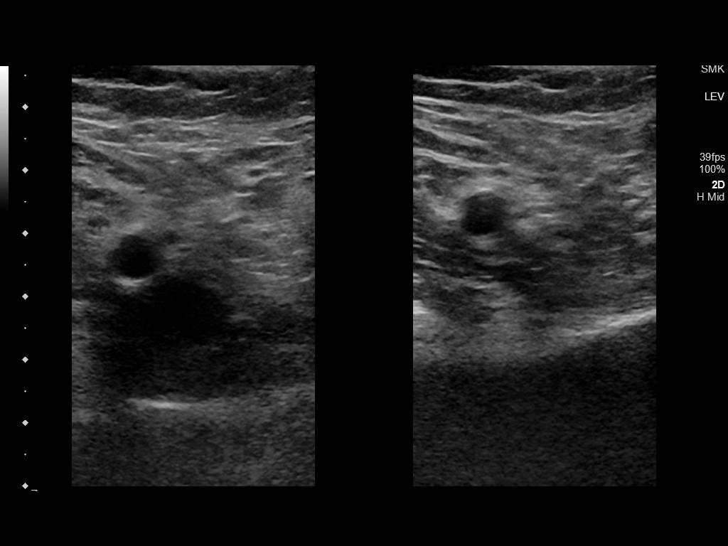
[im 3/34]
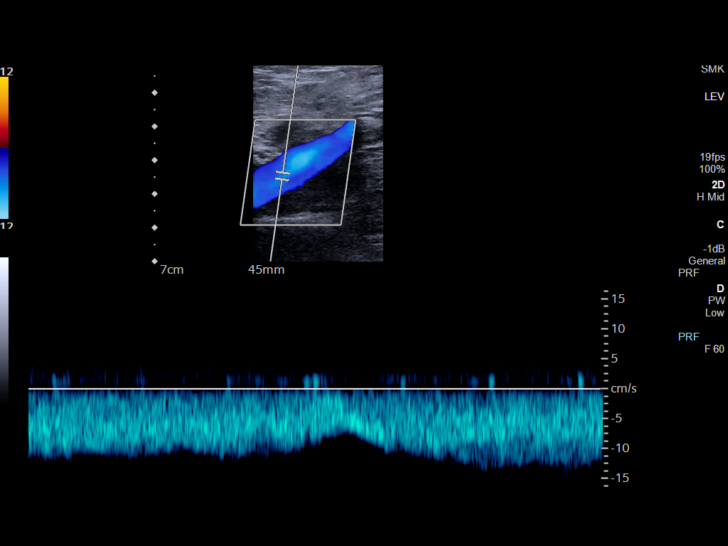
[im 6/34]
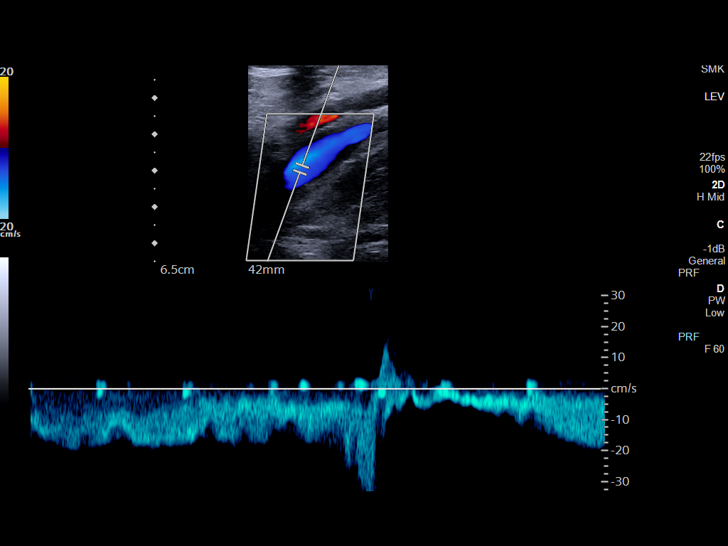
[im 9/34]
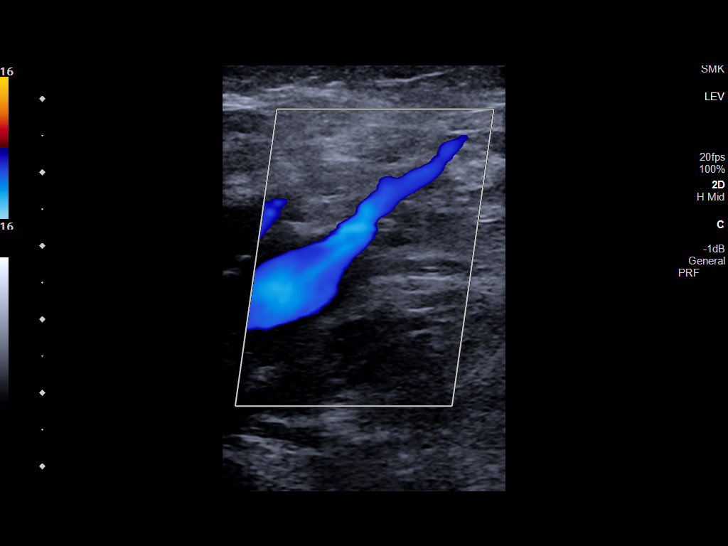
[im 12/34]
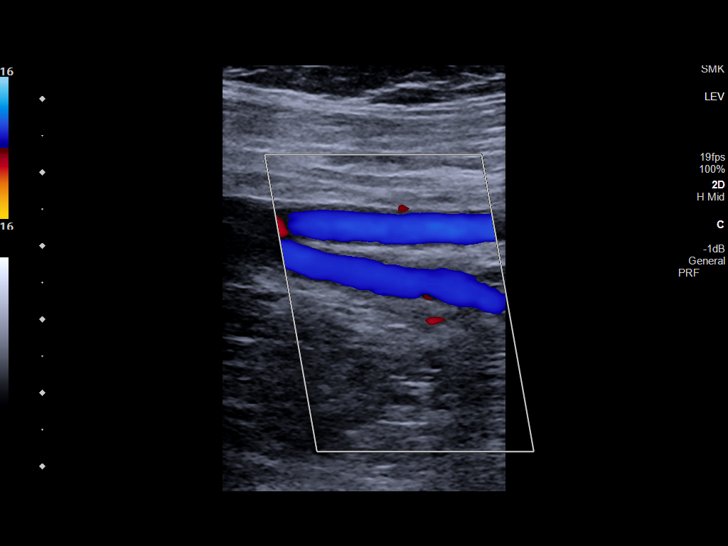
[im 15/34]
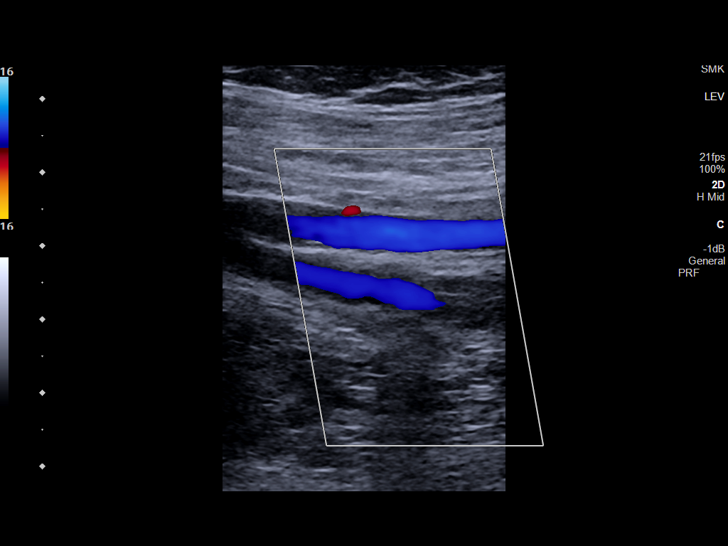
[im 18/34]
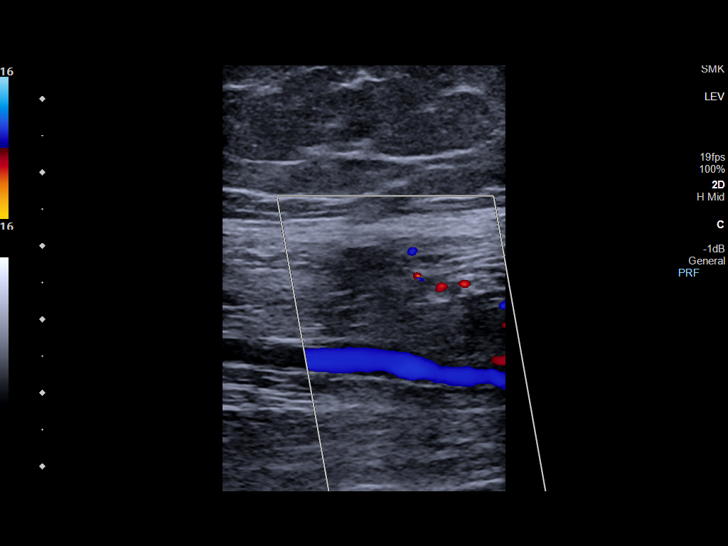
[im 19/34]
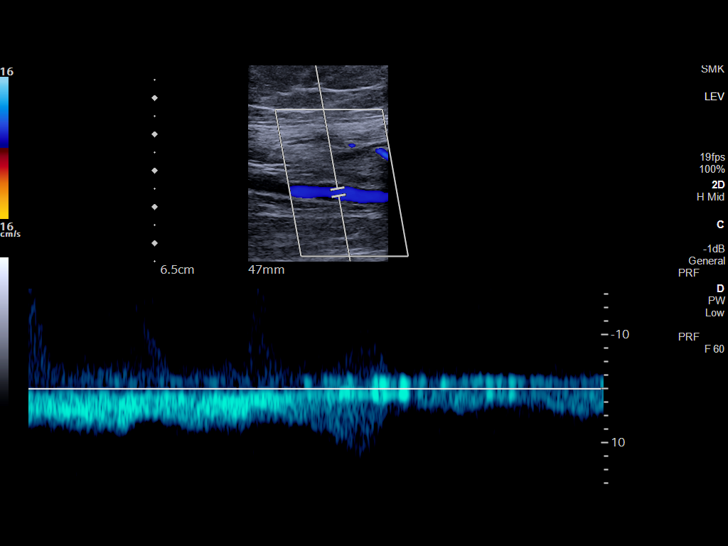
[im 22/34]
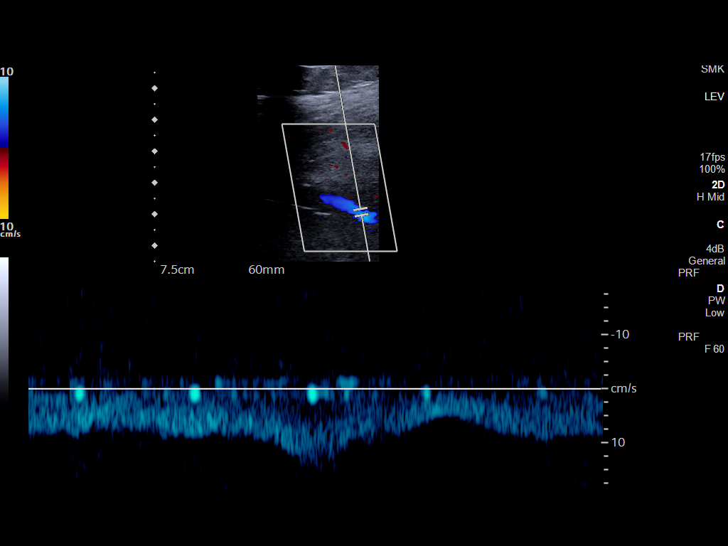
[im 25/34]
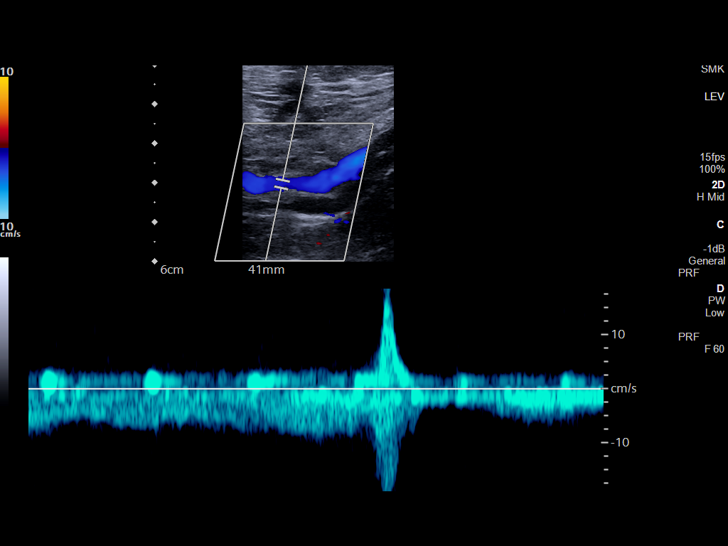
[im 28/34]
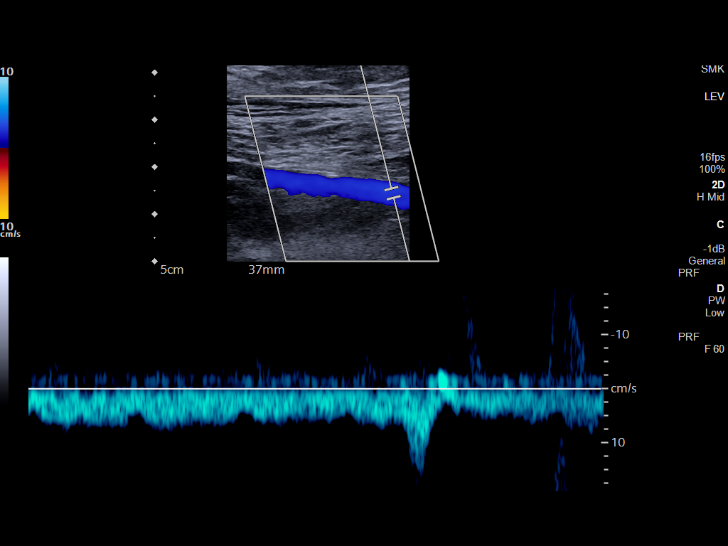
[im 31/34]
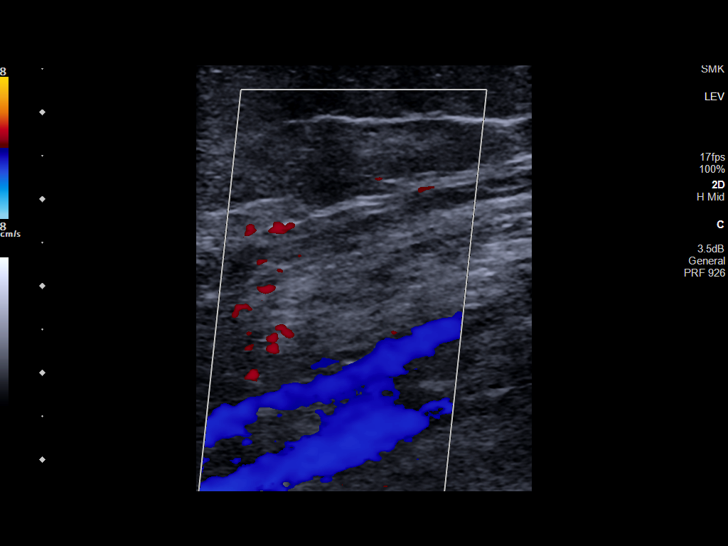
[im 34/34]
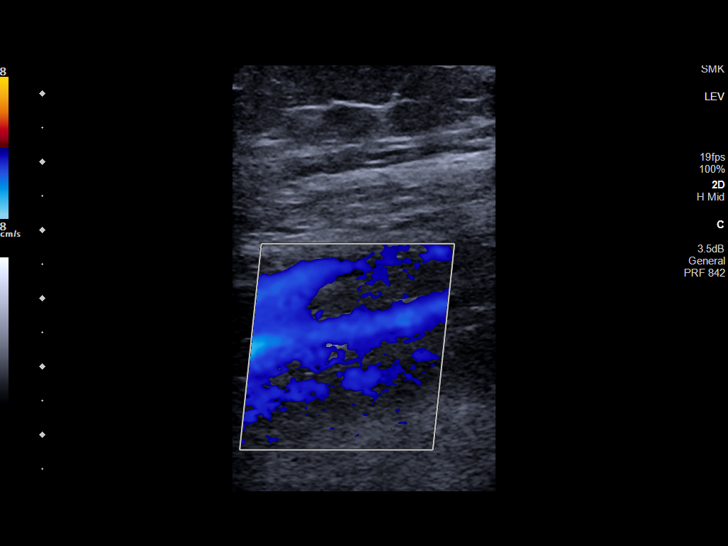

[13 of 24 positions shown; findings below may reference images not displayed]

FINDINGS: LEFT LOWER EXTREMITY

Common Femoral Vein: No evidence of thrombus. Normal
compressibility, respiratory phasicity and response to augmentation.

Central Greater Saphenous Vein: No evidence of thrombus. Normal
compressibility and flow on color Doppler imaging.

Central Profunda Femoral Vein: No evidence of thrombus. Normal
compressibility and flow on color Doppler imaging.

Femoral Vein: No evidence of thrombus. Normal compressibility,
respiratory phasicity and response to augmentation.

Popliteal Vein: No evidence of thrombus. Normal compressibility,
respiratory phasicity and response to augmentation.

Calf Veins: No evidence of thrombus. Normal compressibility and flow
on color Doppler imaging.

Other Findings:  None.

RIGHT LOWER EXTREMITY

Common Femoral Vein: No evidence of thrombus. Normal
compressibility, respiratory phasicity and response to augmentation.
IMPRESSION: No evidence of left lower extremity deep venous thrombosis.

## 2020-06-21 MED ORDER — HYDROCHLOROTHIAZIDE 25 MG PO TABS
12.5000 mg | ORAL_TABLET | Freq: Every day | ORAL | Status: DC
  Administered 2020-06-21 – 2020-07-02 (×12): 12.5 mg via ORAL
  Filled 2020-06-21 (×12): qty 1

## 2020-06-21 MED ORDER — CHOLESTYRAMINE LIGHT 4 G PO PACK
4.0000 g | PACK | Freq: Two times a day (BID) | ORAL | Status: DC
  Administered 2020-06-21 – 2020-06-24 (×7): 4 g via ORAL
  Filled 2020-06-21 (×9): qty 1

## 2020-06-21 NOTE — Progress Notes (Signed)
Patient ID: Michelle Guerrero, female   DOB: 1941/03/18, 79 y.o.   MRN: [id number] [facility] PROGRESS NOTE  CAIRO LINGENFELTER VOZ:[id number] DOB: 1941-04-29 DOA: 06/17/2020 PCP: Nanda Quinton, MD  HPI/Subjective: Patient this morning said she thinks she is having a bowel movement.  She had at least 7 bowel movements yesterday.  She did not take any Imodium.  Patient does complain of some left knee pain.  I was able to move around her left knee without a problem.  Objective: Vitals:   06/20/20 2349 06/21/20 0809  BP: (!) 130/52 (!) 134/58  Pulse: 88 86  Resp: 18 18  Temp: 97.8 F (36.6 C)   SpO2: 97% 96%    Intake/Output Summary (Last 24 hours) at 06/21/2020 1219 Last data filed at 06/21/2020 0600 Gross per 24 hour  Intake 580.47 ml  Output 1100 ml  Net -519.53 ml   Filed Weights   06/17/20 2153  Weight: 82.3 kg    ROS: Review of Systems  Respiratory: Negative for shortness of breath.   Cardiovascular: Negative for chest pain.  Gastrointestinal: Positive for diarrhea. Negative for abdominal pain and nausea.  Musculoskeletal: Positive for joint pain.   Exam: Physical Exam HENT:     Head: Normocephalic.     Mouth/Throat:     Pharynx: No oropharyngeal exudate.  Eyes:     General: Lids are normal.     Conjunctiva/sclera: Conjunctivae normal.     Pupils: Pupils are equal, round, and reactive to light.  Cardiovascular:     Rate and Rhythm: Normal rate and regular rhythm.     Heart sounds: Normal heart sounds, S1 normal and S2 normal.  Pulmonary:     Breath sounds: No decreased breath sounds, wheezing, rhonchi or rales.  Abdominal:     Palpations: Abdomen is soft.     Tenderness: There is no abdominal tenderness.  Musculoskeletal:     Left knee: No swelling. Normal range of motion. Tenderness present.     Right lower leg: No swelling.     Left lower leg: No swelling.  Skin:    General: Skin is warm.     Findings: No rash.     Comments: Left hip surgical site:  No signs of infection.  Neurological:     Mental Status: She is alert and oriented to person, place, and time.       Data Reviewed: Basic Metabolic Panel: Recent Labs  Lab 06/17/20 2201 06/19/20 0343 06/20/20 0503 06/21/20 0400  NA 136 134* 137 134*  K 3.7 3.8 4.2 4.0  CL 101 102 105 102  CO2 20* 20* 18* 21*  GLUCOSE 100* 84 74 100*  BUN 40* 27* 23 25*  CREATININE 1.83* 1.33* 1.35* 1.32*  CALCIUM 9.2 8.5* 8.6* 8.6*   Liver Function Tests: Recent Labs  Lab 06/17/20 2201  AST 19  ALT 12  ALKPHOS 124  BILITOT 0.8  PROT 8.3*  ALBUMIN 3.5   CBC: Recent Labs  Lab 06/17/20 2201 06/19/20 0343 06/20/20 0503 06/21/20 0400  WBC 23.3* 18.8* 15.9* 13.1*  NEUTROABS  --   --  11.4*  --   HGB 12.0 10.4* 10.4* 10.4*  HCT 37.7 32.5* 31.9* 31.6*  MCV 93.5 94.2 93.3 90.8  PLT 528* 324 367 362     Recent Results (from the past 240 hour(s))  Respiratory Panel by RT PCR (Flu A&B, Covid) - Nasopharyngeal Swab     Status: None   Collection Time: 06/18/20  2:44 AM  Specimen: Nasopharyngeal Swab  Result Value Ref Range Status   SARS Coronavirus 2 by RT PCR NEGATIVE NEGATIVE Final    Comment: (NOTE) SARS-CoV-2 target nucleic acids are NOT DETECTED.  The SARS-CoV-2 RNA is generally detectable in upper respiratoy specimens during the acute phase of infection. The lowest concentration of SARS-CoV-2 viral copies this assay can detect is 131 copies/mL. A negative result does not preclude SARS-Cov-2 infection and should not be used as the sole basis for treatment or other patient management decisions. A negative result may occur with  improper specimen collection/handling, submission of specimen other than nasopharyngeal swab, presence of viral mutation(s) within the areas targeted by this assay, and inadequate number of viral copies (<131 copies/mL). A negative result must be combined with clinical observations, patient history, and epidemiological information. The expected  result is Negative.  Fact Sheet for Patients:  PinkCheek.be  Fact Sheet for Healthcare Providers:  GravelBags.it  This test is no t yet approved or cleared by the [place] FDA and  has been authorized for detection and/or diagnosis of SARS-CoV-2 by FDA under an Emergency Use Authorization (EUA). This EUA will remain  in effect (meaning this test can be used) for the duration of the COVID-19 declaration under Section 564(b)(1) of the Act, 21 U.S.C. section 360bbb-3(b)(1), unless the authorization is terminated or revoked sooner.     Influenza A by PCR NEGATIVE NEGATIVE Final   Influenza B by PCR NEGATIVE NEGATIVE Final    Comment: (NOTE) The Xpert Xpress SARS-CoV-2/FLU/RSV assay is intended as an aid in  the diagnosis of influenza from Nasopharyngeal swab specimens and  should not be used as a sole basis for treatment. Nasal washings and  aspirates are unacceptable for Xpert Xpress SARS-CoV-2/FLU/RSV  testing.  Fact Sheet for Patients: PinkCheek.be  Fact Sheet for Healthcare Providers: GravelBags.it  This test is not yet approved or cleared by the [place] FDA and  has been authorized for detection and/or diagnosis of SARS-CoV-2 by  FDA under an Emergency Use Authorization (EUA). This EUA will remain  in effect (meaning this test can be used) for the duration of the  Covid-19 declaration under Section 564(b)(1) of the Act, 21  U.S.C. section 360bbb-3(b)(1), unless the authorization is  terminated or revoked. Performed at [facility], [address] [place], [place] [address]   Gastrointestinal Panel by PCR , Stool     Status: None   Collection Time: 06/19/20 10:20 AM   Specimen: Stool  Result Value Ref Range Status   Campylobacter species NOT DETECTED NOT DETECTED Final   Plesimonas shigelloides NOT DETECTED NOT DETECTED Final    Salmonella species NOT DETECTED NOT DETECTED Final   Yersinia enterocolitica NOT DETECTED NOT DETECTED Final   Vibrio species NOT DETECTED NOT DETECTED Final   Vibrio cholerae NOT DETECTED NOT DETECTED Final   Enteroaggregative E coli (EAEC) NOT DETECTED NOT DETECTED Final   Enteropathogenic E coli (EPEC) NOT DETECTED NOT DETECTED Final   Enterotoxigenic E coli (ETEC) NOT DETECTED NOT DETECTED Final   Shiga like toxin producing E coli (STEC) NOT DETECTED NOT DETECTED Final   Shigella/Enteroinvasive E coli (EIEC) NOT DETECTED NOT DETECTED Final   Cryptosporidium NOT DETECTED NOT DETECTED Final   Cyclospora cayetanensis NOT DETECTED NOT DETECTED Final   Entamoeba histolytica NOT DETECTED NOT DETECTED Final   Giardia lamblia NOT DETECTED NOT DETECTED Final   Adenovirus F40/41 NOT DETECTED NOT DETECTED Final   Astrovirus NOT DETECTED NOT DETECTED Final   Norovirus GI/GII NOT  DETECTED NOT DETECTED Final   Rotavirus A NOT DETECTED NOT DETECTED Final   Sapovirus (I, II, IV, and V) NOT DETECTED NOT DETECTED Final    Comment: Performed at [facility], [place], [place] [address]  C Difficile Quick Screen w PCR reflex     Status: None   Collection Time: 06/19/20 10:20 AM   Specimen: STOOL  Result Value Ref Range Status   C Diff antigen NEGATIVE NEGATIVE Final   C Diff toxin NEGATIVE NEGATIVE Final   C Diff interpretation No C. difficile detected.  Final    Comment: Performed at [facility], [address] [place], [place] [address]     Studies: US Venous Img Lower Unilateral Left (DVT)  Result Date: 06/21/2020 CLINICAL DATA:  79 year old female with leg pain. EXAM: LEFT LOWER EXTREMITY VENOUS DOPPLER ULTRASOUND TECHNIQUE: Gray-scale sonography with graded compression, as well as color Doppler and duplex ultrasound were performed to evaluate the left lower extremity deep venous systems from the level of the common femoral vein and including the common  femoral, femoral, profunda femoral, popliteal and calf veins including the posterior tibial, peroneal and gastrocnemius veins when visible. Spectral Doppler was utilized to evaluate flow at rest and with distal augmentation maneuvers in the common femoral, femoral and popliteal veins. The contralateral common femoral vein was also evaluated for comparison. COMPARISON:  None. FINDINGS: LEFT LOWER EXTREMITY Common Femoral Vein: No evidence of thrombus. Normal compressibility, respiratory phasicity and response to augmentation. Central Greater Saphenous Vein: No evidence of thrombus. Normal compressibility and flow on color Doppler imaging. Central Profunda Femoral Vein: No evidence of thrombus. Normal compressibility and flow on color Doppler imaging. Femoral Vein: No evidence of thrombus. Normal compressibility, respiratory phasicity and response to augmentation. Popliteal Vein: No evidence of thrombus. Normal compressibility, respiratory phasicity and response to augmentation. Calf Veins: No evidence of thrombus. Normal compressibility and flow on color Doppler imaging. Other Findings:  None. RIGHT LOWER EXTREMITY Common Femoral Vein: No evidence of thrombus. Normal compressibility, respiratory phasicity and response to augmentation. IMPRESSION: No evidence of left lower extremity deep venous thrombosis. Ruthann Cancer, MD [facility] Electronically Signed   By: Ruthann Cancer MD   On: 06/21/2020 11:55    Scheduled Meds: . amLODipine  5 mg Oral Daily  . atorvastatin  20 mg Oral Daily  . cholecalciferol  1,000 Units Oral Daily  . cholestyramine light  4 g Oral BID  . ciprofloxacin  250 mg Oral BID  . enoxaparin (LOVENOX) injection  0.5 mg/kg Subcutaneous Q24H  . hydrochlorothiazide  12.5 mg Oral Daily  . lisinopril  40 mg Oral Daily  . loratadine  10 mg Oral Daily  . pantoprazole  40 mg Oral Daily  . sodium bicarbonate  650 mg Oral BID    Continuous Infusions: . sodium chloride 10 mL/hr at 06/21/20 0600  . metronidazole Stopped (06/21/20 0531)    Assessment/Plan:  1. Diarrhea.  Stool comprehensive panel and stool for C. difficile negative.  Patient had 7 bowel movements yesterday.  Continue empiric Cipro and Flagyl.  White blood cell count trending better.  Need to have diarrhea settled down prior to any disposition.  Started cholestyramine today.  Patient hesitant on taking Imodium. 2. Abnormal MRCP of the gallbladder.  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  General surgery here does not want to do any procedure here.  I made a outpatient referral to Dr. Stark Klein.  The daughter will call back  on Monday to set up this appointment. 3. Left knee pain.  Could be referred from recent hip fracture.  X-ray of the knee ordered.  Ultrasound of the left lower extremity negative for DVT 4. Essential hypertension on amlodipine, lisinopril.  I will decrease dose of hydrochlorothiazide to 12.5 mg daily 5. GERD on Protonix 6. Hyperlipidemia unspecified on Lipitor 7. 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  Appreciate wound care consultation 8. Acute kidney injury on chronic kidney disease stage IIIb.  Creatinine seem to stabilize at 1.32.  IV fluids discontinued.    Code Status:     Code Status Orders  (From admission, onward)         Start     Ordered   06/18/20 1021  Full code  Continuous        06/18/20 1026        Code Status History    This patient has a current code status but no historical code status.   Advance Care Planning Activity     Family Communication: Spoke with daughter on the phone Disposition Plan: Status is: Inpatient  Dispo: The patient is from: Home              Anticipated d/c is to: Rehab              Anticipated d/c date is: Potential 06/23/2020 if diarrhea settles down.              Patient currently being treated for acute  diarrhea.  Consultants:  Gastroenterology  General surgery  Antibiotics:  Cipro  Flagyl  Time spent: 28 minutes  [place]

## 2020-06-22 DIAGNOSIS — R531 Weakness: Secondary | ICD-10-CM

## 2020-06-22 DIAGNOSIS — K828 Other specified diseases of gallbladder: Secondary | ICD-10-CM | POA: Diagnosis not present

## 2020-06-22 DIAGNOSIS — K529 Noninfective gastroenteritis and colitis, unspecified: Secondary | ICD-10-CM | POA: Diagnosis not present

## 2020-06-22 DIAGNOSIS — R197 Diarrhea, unspecified: Secondary | ICD-10-CM | POA: Diagnosis not present

## 2020-06-22 DIAGNOSIS — I10 Essential (primary) hypertension: Secondary | ICD-10-CM | POA: Diagnosis not present

## 2020-06-22 LAB — CBC
HCT: 31.8 % — ABNORMAL LOW (ref 36.0–46.0)
Hemoglobin: 10.4 g/dL — ABNORMAL LOW (ref 12.0–15.0)
MCH: 29.8 pg (ref 26.0–34.0)
MCHC: 32.7 g/dL (ref 30.0–36.0)
MCV: 91.1 fL (ref 80.0–100.0)
Platelets: 340 10*3/uL (ref 150–400)
RBC: 3.49 MIL/uL — ABNORMAL LOW (ref 3.87–5.11)
RDW: 14.6 % (ref 11.5–15.5)
WBC: 12.3 10*3/uL — ABNORMAL HIGH (ref 4.0–10.5)
nRBC: 0 % (ref 0.0–0.2)

## 2020-06-22 MED ORDER — FLUTICASONE PROPIONATE 50 MCG/ACT NA SUSP
2.0000 | Freq: Every day | NASAL | Status: DC
  Administered 2020-06-22 – 2020-07-04 (×9): 2 via NASAL
  Filled 2020-06-22: qty 16

## 2020-06-22 NOTE — TOC Initial Note (Signed)
Transition of Care [facility]) - Initial/Assessment Note    Patient Details  Name: Michelle Guerrero MRN: [id number] Date of Birth: [birth year]  Transition of [facility] CM/SW Contact:    Boris Sharper, LCSW Phone Number: 06/22/2020, 1:51 PM  Clinical Narrative:                 CSW contacted pt's daughter to discuss discharge plan as well as PT recommendations. Pt's daughter was agreeable to SNF but does not want pt to go back to [facility]. Pt's daughter prefers [facility] in [place]. CSW faxed to those facilities as well as other surrounding facilities.   TOC will continue to follow  Expected Discharge Plan: [place] Barriers to Discharge: Continued Medical Work up   Patient Goals and CMS Choice Patient states their goals for this hospitalization and ongoing recovery are:: to get services she needs CMS Medicare.gov Compare Post Acute Care list provided to:: Patient Represenative (must comment) Choice offered to / list presented to : Patient, Adult Children  Expected Discharge Plan and Services Expected Discharge Plan: [place] Choice: [place] Living arrangements for the past 2 months: Single Family Home                                      Prior Living Arrangements/Services Living arrangements for the past 2 months: Single Family Home Lives with:: Spouse Patient language and need for interpreter reviewed:: Yes        Need for Family Participation in Patient Care: Yes (Comment) Care giver support system in place?: Yes (comment) (daughter, spouse)   Criminal Activity/Legal Involvement Pertinent to Current Situation/Hospitalization: No - Comment as needed  Activities of Daily Living Home Assistive Devices/Equipment: Environmental consultant (specify type) ADL Screening (condition at time of admission) Patient's cognitive ability adequate to safely complete daily activities?: Yes Is the patient  deaf or have difficulty hearing?: No Does the patient have difficulty seeing, even when wearing glasses/contacts?: No Does the patient have difficulty concentrating, remembering, or making decisions?: Yes Patient able to express need for assistance with ADLs?: Yes Does the patient have difficulty dressing or bathing?: Yes Independently performs ADLs?: No  Permission Sought/Granted   Permission granted to share information with : Yes, Verbal Permission Granted  Share Information with NAME: [name]     Permission granted to share info w Relationship: daughter  Permission granted to share info w Contact Information: [phone]  Emotional Assessment Appearance:: Other (Comment Required (unable to assess) Attitude/Demeanor/Rapport: Unable to Assess Affect (typically observed): Unable to Assess Orientation: : Oriented to Self, Oriented to Place, Oriented to Situation Alcohol / Substance Use: Not Applicable Psych Involvement: No (comment)  Admission diagnosis:  Colitis [K52.9] Abnormal CT scan [R93.89] Acute colitis [K52.9] Gallbladder mass [K82.8] Acute renal failure superimposed on chronic kidney disease, unspecified CKD stage, unspecified acute renal failure type ([place]) [N17.9, N18.9] Patient Active Problem List   Diagnosis Date Noted  . Weakness   . Acute pain of left knee   . Diarrhea   . Gallbladder mass   . Acute kidney injury superimposed on CKD ([place])   . Acute colitis 06/18/2020  . CKD (chronic kidney disease), stage IV ([place]) 06/18/2020  . Essential hypertension 06/18/2020  . GERD (gastroesophageal reflux disease) 06/18/2020  . History of skin cancer 04/02/2020  . Hypertension 04/02/2020  . DNR (do not  resuscitate) discussion 10/27/2018  . Hyperkeratosis of nail 09/20/2018  . Open wound of left upper arm 05/17/2018  . CKD (chronic kidney disease) stage 3, GFR 30-59 ml/min (HCC) 08/10/2017  . Poor dentition 08/04/2016  . Type 2 diabetes mellitus with microalbuminuria  ([place]) 07/21/2016  . Pure hypercholesterolemia 05/07/2015   PCP:  Nanda Quinton, MD Pharmacy:   [facility] [address] [place] [facility] [place] [address] [place] [address] Phone: [phone] Fax: [phone]     Social Determinants of Health (SDOH) Interventions    Readmission Risk Interventions No flowsheet data found.

## 2020-06-22 NOTE — NC FL2 (Signed)
[place] LEVEL OF CARE SCREENING TOOL     IDENTIFICATION  Patient Name: Michelle Guerrero Birthdate: 24-Jul-1941 Sex: female Admission Date (Current Location): 06/17/2020  [place] and [place] Number:  Engineering geologist and Address:  [facility], [address], [place], [place] [address]      Provider Number: [id number]  Attending Physician Name and Address:  Loletha Grayer, MD  Relative Name and Phone Number:  [phone]    Current Level of Care: Hospital Recommended Level of Care: [place] Prior Approval Number:    Date Approved/Denied:   PASRR Number: [id number]  Discharge Plan: SNF    Current Diagnoses: Patient Active Problem List   Diagnosis Date Noted  . Weakness   . Acute pain of left knee   . Diarrhea   . Gallbladder mass   . Acute kidney injury superimposed on CKD ([place])   . Acute colitis 06/18/2020  . CKD (chronic kidney disease), stage IV ([place]) 06/18/2020  . Essential hypertension 06/18/2020  . GERD (gastroesophageal reflux disease) 06/18/2020  . History of skin cancer 04/02/2020  . Hypertension 04/02/2020  . DNR (do not resuscitate) discussion 10/27/2018  . Hyperkeratosis of nail 09/20/2018  . Open wound of left upper arm 05/17/2018  . CKD (chronic kidney disease) stage 3, GFR 30-59 ml/min (HCC) 08/10/2017  . Poor dentition 08/04/2016  . Type 2 diabetes mellitus with microalbuminuria ([place]) 07/21/2016  . Pure hypercholesterolemia 05/07/2015    Orientation RESPIRATION BLADDER Height & Weight     Self, Situation, Place  Normal Incontinent Weight: 181 lb 7 oz (82.3 kg) Height:  5\' 2"  (157.5 cm)  BEHAVIORAL SYMPTOMS/MOOD NEUROLOGICAL BOWEL NUTRITION STATUS      Incontinent Diet  AMBULATORY STATUS COMMUNICATION OF NEEDS Skin   Limited Assist Verbally Normal                       Personal Care Assistance Level of Assistance  Bathing, Feeding, Dressing Bathing Assistance: Limited  assistance Feeding assistance: Limited assistance Dressing Assistance: Limited assistance     Functional Limitations Info  Sight, Hearing, Speech Sight Info: Adequate Hearing Info: Adequate Speech Info: Adequate    SPECIAL CARE FACTORS FREQUENCY  PT (By licensed PT), OT (By licensed OT)     PT Frequency: 5x week OT Frequency: 5x week            Contractures      Additional Factors Info  Code Status, Allergies Code Status Info: Full Allergies Info: Penicillins, Montelukast           Current Medications (06/22/2020):  This is the current hospital active medication list Current Facility-Administered Medications  Medication Dose Route Frequency Provider Last Rate Last Admin  . 0.9 %  sodium chloride infusion   Intravenous PRN Loletha Grayer, MD 10 mL/hr at 06/22/20 0441 Restarted at 06/22/20 0441  . acetaminophen (TYLENOL) tablet 1,000 mg  1,000 mg Oral Q6H PRN Agbata, Tochukwu, MD   1,000 mg at 06/19/20 2242  . amLODipine (NORVASC) tablet 5 mg  5 mg Oral Daily Agbata, Tochukwu, MD   5 mg at 06/22/20 0935  . atorvastatin (LIPITOR) tablet 20 mg  20 mg Oral Daily Rowland Lathe, RPH   20 mg at 06/21/20 2335  . cholecalciferol (VITAMIN D3) tablet 1,000 Units  1,000 Units Oral Daily Agbata, Tochukwu, MD   1,000 Units at 06/22/20 0928  . cholestyramine light (PREVALITE) packet 4 g  4 g Oral BID Loletha Grayer, MD  4 g at 06/22/20 0929  . ciprofloxacin (CIPRO) tablet 250 mg  250 mg Oral BID Loletha Grayer, MD   250 mg at 06/22/20 0929  . enoxaparin (LOVENOX) injection 40 mg  0.5 mg/kg Subcutaneous Q24H Loletha Grayer, MD   40 mg at 06/21/20 2336  . fluticasone (FLONASE) 50 MCG/ACT nasal spray 2 spray  2 spray Each Nare Daily Loletha Grayer, MD   2 spray at 06/22/20 0953  . hydrochlorothiazide (HYDRODIURIL) tablet 12.5 mg  12.5 mg Oral Daily Loletha Grayer, MD   12.5 mg at 06/22/20 0935  . lisinopril (ZESTRIL) tablet 40 mg  40 mg Oral Daily Agbata, Tochukwu, MD   40  mg at 06/22/20 0928  . loperamide (IMODIUM) capsule 2 mg  2 mg Oral Q8H PRN Wieting, Richard, MD      . loratadine (CLARITIN) tablet 10 mg  10 mg Oral Daily Agbata, Tochukwu, MD   10 mg at 06/22/20 0928  . metroNIDAZOLE (FLAGYL) IVPB 500 mg  500 mg Intravenous Q8H Wieting, Richard, MD 100 mL/hr at 06/22/20 1249 500 mg at 06/22/20 1249  . ondansetron (ZOFRAN) tablet 4 mg  4 mg Oral Q6H PRN Agbata, Tochukwu, MD       Or  . ondansetron (ZOFRAN) injection 4 mg  4 mg Intravenous Q6H PRN Agbata, Tochukwu, MD      . pantoprazole (PROTONIX) EC tablet 40 mg  40 mg Oral Daily Agbata, Tochukwu, MD   40 mg at 06/22/20 0928  . sodium bicarbonate tablet 650 mg  650 mg Oral BID Agbata, Tochukwu, MD   650 mg at 06/22/20 8588     Discharge Medications: Please see discharge summary for a list of discharge medications.  Relevant Imaging Results:  Relevant Lab Results:   Additional Information SS: [id number]  Boris Sharper, LCSW

## 2020-06-22 NOTE — Progress Notes (Signed)
Patient ID: Michelle Guerrero, female   DOB: 09/11/1940, 79 y.o.   MRN: [id number] [facility] PROGRESS NOTE  Michelle Guerrero QQV:[id number] DOB: May 24, 1941 DOA: 06/17/2020 PCP: Nanda Quinton, MD  HPI/Subjective: Patient had 4 bowel movements yesterday.  Patient states that she feels a little bit better today.  No abdominal pain.  No nausea or vomiting.  Came in with diarrhea.  Objective: Vitals:   06/21/20 2336 06/22/20 0808  BP: 126/60 (!) 117/56  Pulse: 86 81  Resp: 16 17  Temp: 97.7 F (36.5 C) 97.6 F (36.4 C)  SpO2: 98% 98%    Intake/Output Summary (Last 24 hours) at 06/22/2020 1201 Last data filed at 06/22/2020 1013 Gross per 24 hour  Intake 471.67 ml  Output 2100 ml  Net -1628.33 ml   Filed Weights   06/17/20 2153  Weight: 82.3 kg    ROS: Review of Systems  Respiratory: Negative for shortness of breath.   Cardiovascular: Negative for chest pain.  Gastrointestinal: Positive for diarrhea. Negative for abdominal pain, nausea and vomiting.   Exam: Physical Exam HENT:     Head: Normocephalic.     Mouth/Throat:     Pharynx: No oropharyngeal exudate.  Eyes:     General: Lids are normal.     Conjunctiva/sclera: Conjunctivae normal.     Pupils: Pupils are equal, round, and reactive to light.  Cardiovascular:     Rate and Rhythm: Normal rate and regular rhythm.     Heart sounds: Normal heart sounds, S1 normal and S2 normal.  Pulmonary:     Breath sounds: Examination of the right-lower field reveals decreased breath sounds. Examination of the left-lower field reveals decreased breath sounds. Decreased breath sounds present. No wheezing, rhonchi or rales.  Abdominal:     Palpations: Abdomen is soft.     Tenderness: There is no abdominal tenderness.  Musculoskeletal:     Right lower leg: No swelling.     Left lower leg: No swelling.  Skin:    General: Skin is warm.     Comments: Left arm ulceration covered Band-Aid over nose.  Neurological:     Mental  Status: She is alert and oriented to person, place, and time.       Data Reviewed: Basic Metabolic Panel: Recent Labs  Lab 06/17/20 2201 06/19/20 0343 06/20/20 0503 06/21/20 0400  NA 136 134* 137 134*  K 3.7 3.8 4.2 4.0  CL 101 102 105 102  CO2 20* 20* 18* 21*  GLUCOSE 100* 84 74 100*  BUN 40* 27* 23 25*  CREATININE 1.83* 1.33* 1.35* 1.32*  CALCIUM 9.2 8.5* 8.6* 8.6*   Liver Function Tests: Recent Labs  Lab 06/17/20 2201  AST 19  ALT 12  ALKPHOS 124  BILITOT 0.8  PROT 8.3*  ALBUMIN 3.5   No results for input(s): LIPASE, AMYLASE in the last 168 hours. No results for input(s): AMMONIA in the last 168 hours. CBC: Recent Labs  Lab 06/17/20 2201 06/19/20 0343 06/20/20 0503 06/21/20 0400 06/22/20 0452  WBC 23.3* 18.8* 15.9* 13.1* 12.3*  NEUTROABS  --   --  11.4*  --   --   HGB 12.0 10.4* 10.4* 10.4* 10.4*  HCT 37.7 32.5* 31.9* 31.6* 31.8*  MCV 93.5 94.2 93.3 90.8 91.1  PLT 528* 324 367 362 340   Cardiac Enzymes: No results for input(s): CKTOTAL, CKMB, CKMBINDEX, TROPONINI in the last 168 hours. BNP (last 3 results) No results for input(s): BNP in the last 8760 hours.  ProBNP (  last 3 results) No results for input(s): PROBNP in the last 8760 hours.  CBG: No results for input(s): GLUCAP in the last 168 hours.  Recent Results (from the past 240 hour(s))  Respiratory Panel by RT PCR (Flu A&B, Covid) - Nasopharyngeal Swab     Status: None   Collection Time: 06/18/20  2:44 AM   Specimen: Nasopharyngeal Swab  Result Value Ref Range Status   SARS Coronavirus 2 by RT PCR NEGATIVE NEGATIVE Final    Comment: (NOTE) SARS-CoV-2 target nucleic acids are NOT DETECTED.  The SARS-CoV-2 RNA is generally detectable in upper respiratoy specimens during the acute phase of infection. The lowest concentration of SARS-CoV-2 viral copies this assay can detect is 131 copies/mL. A negative result does not preclude SARS-Cov-2 infection and should not be used as the sole basis  for treatment or other patient management decisions. A negative result may occur with  improper specimen collection/handling, submission of specimen other than nasopharyngeal swab, presence of viral mutation(s) within the areas targeted by this assay, and inadequate number of viral copies (<131 copies/mL). A negative result must be combined with clinical observations, patient history, and epidemiological information. The expected result is Negative.  Fact Sheet for Patients:  PinkCheek.be  Fact Sheet for Healthcare Providers:  GravelBags.it  This test is no t yet approved or cleared by the [place] FDA and  has been authorized for detection and/or diagnosis of SARS-CoV-2 by FDA under an Emergency Use Authorization (EUA). This EUA will remain  in effect (meaning this test can be used) for the duration of the COVID-19 declaration under Section 564(b)(1) of the Act, 21 U.S.C. section 360bbb-3(b)(1), unless the authorization is terminated or revoked sooner.     Influenza A by PCR NEGATIVE NEGATIVE Final   Influenza B by PCR NEGATIVE NEGATIVE Final    Comment: (NOTE) The Xpert Xpress SARS-CoV-2/FLU/RSV assay is intended as an aid in  the diagnosis of influenza from Nasopharyngeal swab specimens and  should not be used as a sole basis for treatment. Nasal washings and  aspirates are unacceptable for Xpert Xpress SARS-CoV-2/FLU/RSV  testing.  Fact Sheet for Patients: PinkCheek.be  Fact Sheet for Healthcare Providers: GravelBags.it  This test is not yet approved or cleared by the [place] FDA and  has been authorized for detection and/or diagnosis of SARS-CoV-2 by  FDA under an Emergency Use Authorization (EUA). This EUA will remain  in effect (meaning this test can be used) for the duration of the  Covid-19 declaration under Section 564(b)(1) of the Act, 21   U.S.C. section 360bbb-3(b)(1), unless the authorization is  terminated or revoked. Performed at [facility], [address] [place], [place] [address]   Gastrointestinal Panel by PCR , Stool     Status: None   Collection Time: 06/19/20 10:20 AM   Specimen: Stool  Result Value Ref Range Status   Campylobacter species NOT DETECTED NOT DETECTED Final   Plesimonas shigelloides NOT DETECTED NOT DETECTED Final   Salmonella species NOT DETECTED NOT DETECTED Final   Yersinia enterocolitica NOT DETECTED NOT DETECTED Final   Vibrio species NOT DETECTED NOT DETECTED Final   Vibrio cholerae NOT DETECTED NOT DETECTED Final   Enteroaggregative E coli (EAEC) NOT DETECTED NOT DETECTED Final   Enteropathogenic E coli (EPEC) NOT DETECTED NOT DETECTED Final   Enterotoxigenic E coli (ETEC) NOT DETECTED NOT DETECTED Final   Shiga like toxin producing E coli (STEC) NOT DETECTED NOT DETECTED Final   Shigella/Enteroinvasive E coli (EIEC) NOT  DETECTED NOT DETECTED Final   Cryptosporidium NOT DETECTED NOT DETECTED Final   Cyclospora cayetanensis NOT DETECTED NOT DETECTED Final   Entamoeba histolytica NOT DETECTED NOT DETECTED Final   Giardia lamblia NOT DETECTED NOT DETECTED Final   Adenovirus F40/41 NOT DETECTED NOT DETECTED Final   Astrovirus NOT DETECTED NOT DETECTED Final   Norovirus GI/GII NOT DETECTED NOT DETECTED Final   Rotavirus A NOT DETECTED NOT DETECTED Final   Sapovirus (I, II, IV, and V) NOT DETECTED NOT DETECTED Final    Comment: Performed at [facility], [place], [place] [address]  C Difficile Quick Screen w PCR reflex     Status: None   Collection Time: 06/19/20 10:20 AM   Specimen: STOOL  Result Value Ref Range Status   C Diff antigen NEGATIVE NEGATIVE Final   C Diff toxin NEGATIVE NEGATIVE Final   C Diff interpretation No C. difficile detected.  Final    Comment: Performed at [facility], [address] [place], [place] [address]      Studies: DG Knee 1-2 Views Left  Result Date: 06/21/2020 CLINICAL DATA:  Fall 3 weeks ago.  Left knee pain. EXAM: LEFT KNEE - 1-2 VIEW COMPARISON:  None. FINDINGS: No fracture. Knee joint is normally spaced and aligned. No significant arthropathic/degenerative changes. No joint effusion. Scattered posterior arterial vascular calcifications. The inferior margin of an intramedullary rod is noted in the distal femoral shaft, well seated and centrally positioned. IMPRESSION: 1. No fracture.  No joint abnormality. Electronically Signed   By: Lajean Manes M.D.   On: 06/21/2020 13:21   US Venous Img Lower Unilateral Left (DVT)  Result Date: 06/21/2020 CLINICAL DATA:  79 year old female with leg pain. EXAM: LEFT LOWER EXTREMITY VENOUS DOPPLER ULTRASOUND TECHNIQUE: Gray-scale sonography with graded compression, as well as color Doppler and duplex ultrasound were performed to evaluate the left lower extremity deep venous systems from the level of the common femoral vein and including the common femoral, femoral, profunda femoral, popliteal and calf veins including the posterior tibial, peroneal and gastrocnemius veins when visible. Spectral Doppler was utilized to evaluate flow at rest and with distal augmentation maneuvers in the common femoral, femoral and popliteal veins. The contralateral common femoral vein was also evaluated for comparison. COMPARISON:  None. FINDINGS: LEFT LOWER EXTREMITY Common Femoral Vein: No evidence of thrombus. Normal compressibility, respiratory phasicity and response to augmentation. Central Greater Saphenous Vein: No evidence of thrombus. Normal compressibility and flow on color Doppler imaging. Central Profunda Femoral Vein: No evidence of thrombus. Normal compressibility and flow on color Doppler imaging. Femoral Vein: No evidence of thrombus. Normal compressibility, respiratory phasicity and response to augmentation. Popliteal Vein: No evidence of thrombus. Normal  compressibility, respiratory phasicity and response to augmentation. Calf Veins: No evidence of thrombus. Normal compressibility and flow on color Doppler imaging. Other Findings:  None. RIGHT LOWER EXTREMITY Common Femoral Vein: No evidence of thrombus. Normal compressibility, respiratory phasicity and response to augmentation. IMPRESSION: No evidence of left lower extremity deep venous thrombosis. Ruthann Cancer, MD [facility] Electronically Signed   By: Ruthann Cancer MD   On: 06/21/2020 11:55    Scheduled Meds: . amLODipine  5 mg Oral Daily  . atorvastatin  20 mg Oral Daily  . cholecalciferol  1,000 Units Oral Daily  . cholestyramine light  4 g Oral BID  . ciprofloxacin  250 mg Oral BID  . enoxaparin (LOVENOX) injection  0.5 mg/kg Subcutaneous Q24H  .  fluticasone  2 spray Each Nare Daily  . hydrochlorothiazide  12.5 mg Oral Daily  . lisinopril  40 mg Oral Daily  . loratadine  10 mg Oral Daily  . pantoprazole  40 mg Oral Daily  . sodium bicarbonate  650 mg Oral BID   Continuous Infusions: . sodium chloride 10 mL/hr at 06/22/20 0441  . metronidazole 500 mg (06/22/20 0442)    Assessment/Plan:  1. Diarrhea with acute colitis.  Empiric Cipro and Flagyl.  Patient had 4 bowel movements yesterday.  Continue to monitor the amount of bowel movements.  Stool comprehensive panel and stool for C. difficile negative.  Continue cholestyramine.  Patient hesitant on taking Imodium. 2. 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  General surgery here wanted to refer to Dr. Barry Dienes as outpatient.  Daughter will call back on Monday to set up appointment. 3. Essential hypertension.  Continue amlodipine and lisinopril and hydrochlorothiazide lower dose. 4. GERD.  Continue Protonix 5. Hyperlipidemia unspecified.  Continue Lipitor 6. 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  Local wound care. 7. Acute kidney  injury on chronic kidney disease stage IIIb.  Creatinine stabilized at 1.32. 8. Weakness.  Physical therapy recommending rehab     Code Status:     Code Status Orders  (From admission, onward)         Start     Ordered   06/18/20 1021  Full code  Continuous        06/18/20 1026        Code Status History    This patient has a current code status but no historical code status.   Advance Care Planning Activity     Family Communication: Spoke with daughter on the phone Disposition Plan: Status is: Inpatient  Dispo: The patient is from: Home              Anticipated d/c is to: Rehab              Anticipated d/c date is: Potential 06/23/2020 versus 06/24/2020              Patient currently still having diarrhea and had 4 bowel movements yesterday.  Count of bowel movements today and tomorrow morning and see how she does.  Antibiotics:  Cipro  Flagyl  Time spent: 27 minutes  [place]

## 2020-06-23 ENCOUNTER — Inpatient Hospital Stay: Payer: Medicare HMO

## 2020-06-23 DIAGNOSIS — K828 Other specified diseases of gallbladder: Secondary | ICD-10-CM | POA: Diagnosis not present

## 2020-06-23 DIAGNOSIS — K529 Noninfective gastroenteritis and colitis, unspecified: Secondary | ICD-10-CM | POA: Diagnosis not present

## 2020-06-23 DIAGNOSIS — R197 Diarrhea, unspecified: Secondary | ICD-10-CM | POA: Diagnosis not present

## 2020-06-23 DIAGNOSIS — R112 Nausea with vomiting, unspecified: Secondary | ICD-10-CM | POA: Diagnosis not present

## 2020-06-23 IMAGING — DX DG CHEST 1V PORT
1 series · 1 of 1 positions shown · non-contrast
Comparison: None.

CLINICAL DATA: Onset cough yesterday.

EXAM:
PORTABLE CHEST 1 VIEW

[chest ap]
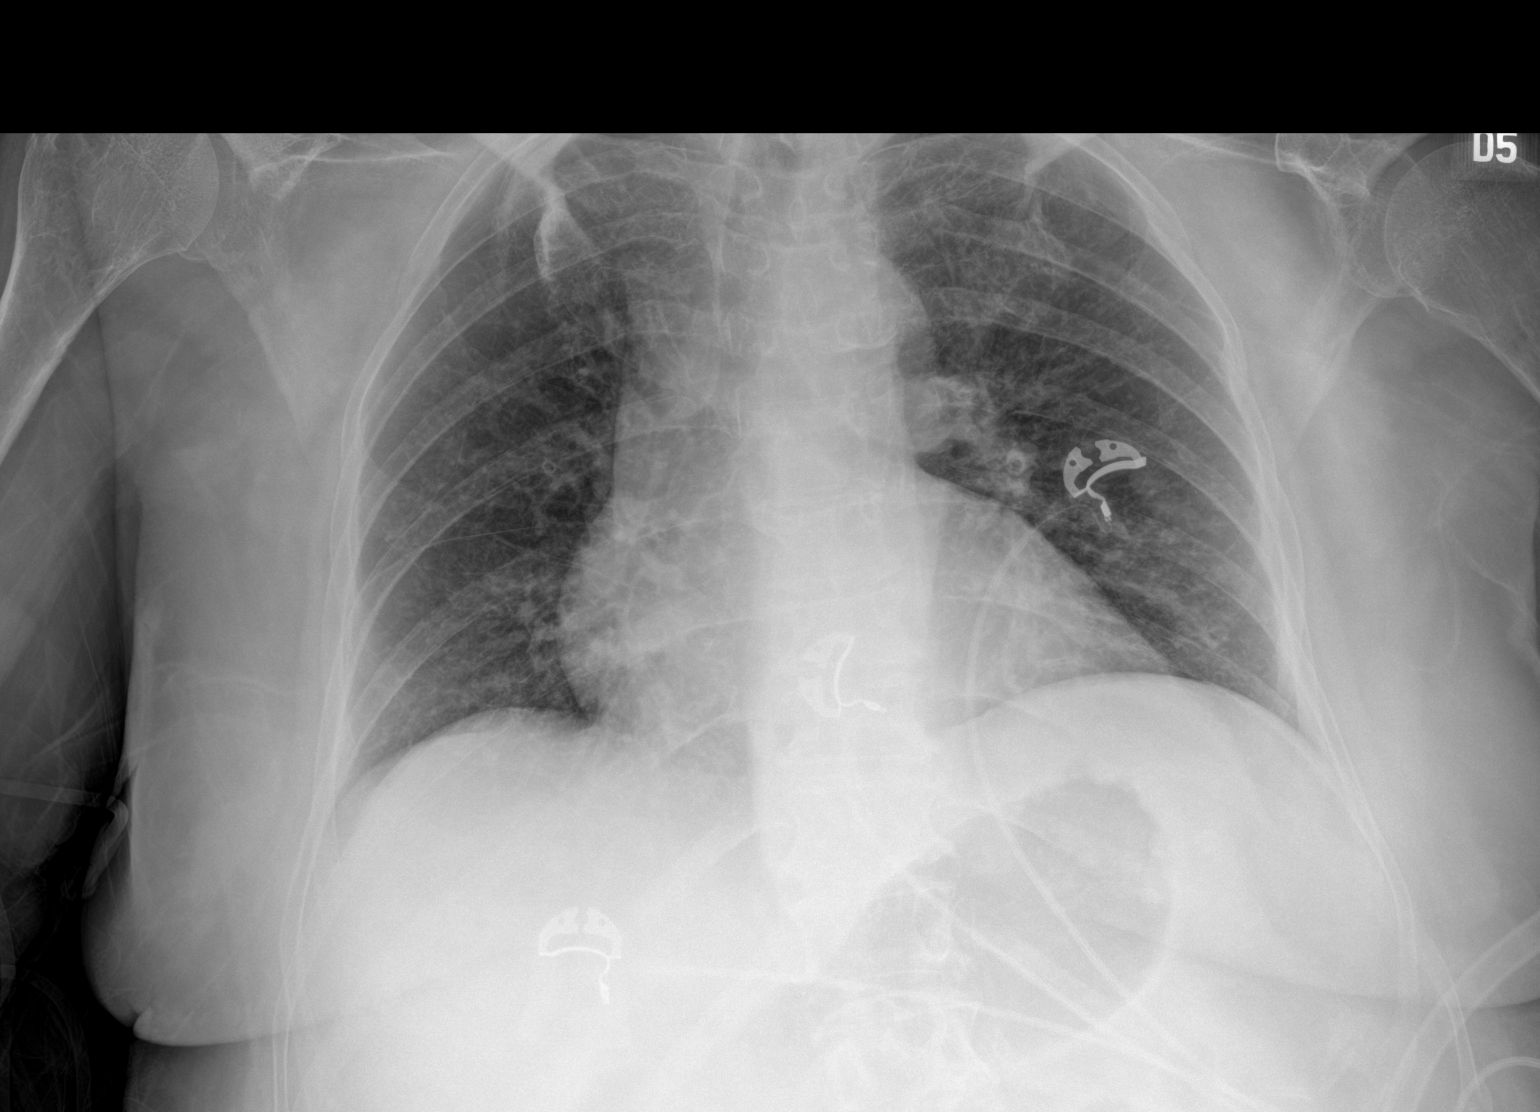

[1 of 1 positions shown; findings below may reference images not displayed]

FINDINGS: Lungs clear. Heart size normal. No pneumothorax or pleural fluid. No
acute bony abnormality.
IMPRESSION: No acute disease.

## 2020-06-23 MED ORDER — METRONIDAZOLE 500 MG PO TABS
500.0000 mg | ORAL_TABLET | Freq: Three times a day (TID) | ORAL | Status: AC
  Administered 2020-06-23 – 2020-06-24 (×5): 500 mg via ORAL
  Filled 2020-06-23 (×5): qty 1

## 2020-06-23 NOTE — TOC Progression Note (Signed)
Transition of Care [facility]) - Progression Note    Patient Details  Name: LAWSON ISABELL MRN: [id number] Date of Birth: October 04, 1940  Transition of Care [facility]) CM/SW [place], RN Phone Number: 06/23/2020, 3:43 PM  Clinical Narrative:   Daughter's first choices of [facility] are not in network with patient's insurance. This RNCM reached out to [facility] to see if they can extend bed offer.     Expected Discharge Plan: [place] Barriers to Discharge: Continued Medical Work up  Expected Discharge Plan and Services Expected Discharge Plan: [place] Choice: [place] arrangements for the past 2 months: Single Family Home                                       Social Determinants of Health (SDOH) Interventions    Readmission Risk Interventions No flowsheet data found.

## 2020-06-23 NOTE — Progress Notes (Signed)
Physical Therapy Treatment Patient Details Name: Michelle Guerrero MRN: [id number] DOB: 07/14/1941 Today's Date: 06/23/2020    History of Present Illness Michelle Guerrero is a 79 y.o. 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 MD assessment includes acute collitis and abnormal gallbladder ultrasound.    PT Comments    Pt agreeable to tx, but declines OOB activity nor sitting EOB reporting fatigue & LLE pain (1-2/10). Pt agreeable to bed level BLE exercises but demonstrates impulsivity with exercises and decreased ability to follow education/cuing re: proper technique. Pt with LLE weakness as noted by need of AAROM for 2 exercises. PT educates pt on need for OOB activity for strengthening through standing/weight bearing & to increase independence with mobility tasks. Pt voiced understanding but continues to decline on this date.    Follow Up Recommendations  SNF     Equipment Recommendations       Recommendations for Other Services       Precautions / Restrictions Precautions Precautions: Fall Precaution Comments: L Hip s/p ORIF approx. 3 weeks ago Restrictions Weight Bearing Restrictions: Yes LLE Weight Bearing: Weight bearing as tolerated    Mobility  Bed Mobility                  Transfers                    Ambulation/Gait                 Stairs             Wheelchair Mobility    Modified Rankin (Stroke Patients Only)       Balance                                            Cognition Arousal/Alertness: Awake/alert Behavior During Therapy: WFL for tasks assessed/performed Overall Cognitive Status: No family/caregiver present to determine baseline cognitive functioning                                  General Comments: Pt demonstrates decreased learning/understanding when PT provides cuing for BLE exercises      Exercises Other Exercises Other Exercises: pt educated about the importance of mobility Other Exercises: BLE exercises: AP, heel slides (AAROM LLE), SAQ's, hip abduction, SLR (AAROM) all 15 reps each    General Comments        Pertinent Vitals/Pain Pain Assessment: 0-10 Pain Score: 1  Pain Location: L leg Pain Descriptors / Indicators: Sore Pain Intervention(s): Monitored during session    Home Living                      Prior Function            PT Goals (current goals can now be found in the care plan section) Acute Rehab PT Goals Patient Stated Goal: none stated PT Goal Formulation: With patient Time For Goal Achievement: 07/02/20 Potential to Achieve Goals: Fair Progress towards PT goals: Not progressing toward goals - comment (pt declining OOB activities)    Frequency    Min  2X/week      PT Plan Current plan remains appropriate    Co-evaluation              AM-PAC PT "6 Clicks" Mobility   Outcome Measure  Help needed turning from your back to your side while in a flat bed without using bedrails?: A Lot Help needed moving from lying on your back to sitting on the side of a flat bed without using bedrails?: A Lot Help needed moving to and from a bed to a chair (including a wheelchair)?: A Lot Help needed standing up from a chair using your arms (e.g., wheelchair or bedside chair)?: A Lot Help needed to walk in hospital room?: A Lot Help needed climbing 3-5 steps with a railing? : A Lot 6 Click Score: 12    End of Session   Activity Tolerance: Patient limited by fatigue Patient left: in bed;with call bell/phone within reach;with SCD's reapplied   PT Visit Diagnosis: Muscle weakness (generalized) (M62.81);Other abnormalities of gait and mobility (R26.89);History of falling  (Z91.81)     Time: 5732-2025 PT Time Calculation (min) (ACUTE ONLY): 13 min  Charges:  $Therapeutic Exercise: 8-22 mins                     Lavone Nian, PT, DPT 06/23/20, 11:56 AM

## 2020-06-23 NOTE — Progress Notes (Signed)
Patient ID: Michelle Guerrero, female   DOB: 02/22/1941, 79 y.o.   MRN: [id number] [facility] PROGRESS NOTE  Michelle Guerrero TOI:[id number] DOB: 11/08/1940 DOA: 06/17/2020 PCP: Nanda Quinton, MD  HPI/Subjective: Patient stated that she vomited last night and felt nauseous this morning.  Patient also states that she had some chills this morning but no fever.  No abdominal pain.  Slight cough.  Had 2 episodes of diarrhea yesterday and 2 other bowel movements recorded but the stool was not graded as a type which could have been a smear.  Patient came in with diarrhea and colitis.  Objective: Vitals:   06/23/20 0021 06/23/20 0746  BP: (!) 123/52 (!) 104/52  Pulse: 82 82  Resp:  18  Temp: 97.9 F (36.6 C) 98 F (36.7 C)  SpO2: 97% 99%    Intake/Output Summary (Last 24 hours) at 06/23/2020 1428 Last data filed at 06/23/2020 1345 Gross per 24 hour  Intake 480 ml  Output --  Net 480 ml   Filed Weights   06/17/20 2153  Weight: 82.3 kg    ROS: Review of Systems  Constitutional: Positive for chills.  Respiratory: Negative for shortness of breath.   Cardiovascular: Negative for chest pain.  Gastrointestinal: Positive for diarrhea, nausea and vomiting. Negative for abdominal pain.   Exam: Physical Exam HENT:     Head: Normocephalic.     Mouth/Throat:     Pharynx: No oropharyngeal exudate.  Eyes:     General: Lids are normal.     Conjunctiva/sclera: Conjunctivae normal.     Pupils: Pupils are equal, round, and reactive to light.  Cardiovascular:     Rate and Rhythm: Normal rate and regular rhythm.     Heart sounds: Normal heart sounds, S1 normal and S2 normal.  Pulmonary:     Breath sounds: Examination of the right-lower field reveals decreased breath sounds. Examination of the left-lower field reveals decreased breath sounds. Decreased breath sounds present. No wheezing, rhonchi or rales.  Abdominal:     Palpations: Abdomen is soft.     Tenderness: There is no abdominal  tenderness.  Musculoskeletal:     Right ankle: Swelling present.     Left ankle: Swelling present.  Skin:    General: Skin is warm.     Findings: No rash.     Comments: Left arm covered, nose covered with Band-Aid  Neurological:     Mental Status: She is alert and oriented to person, place, and time.       Data Reviewed: Basic Metabolic Panel: Recent Labs  Lab 06/17/20 2201 06/19/20 0343 06/20/20 0503 06/21/20 0400  NA 136 134* 137 134*  K 3.7 3.8 4.2 4.0  CL 101 102 105 102  CO2 20* 20* 18* 21*  GLUCOSE 100* 84 74 100*  BUN 40* 27* 23 25*  CREATININE 1.83* 1.33* 1.35* 1.32*  CALCIUM 9.2 8.5* 8.6* 8.6*   Liver Function Tests: Recent Labs  Lab 06/17/20 2201  AST 19  ALT 12  ALKPHOS 124  BILITOT 0.8  PROT 8.3*  ALBUMIN 3.5   CBC: Recent Labs  Lab 06/17/20 2201 06/19/20 0343 06/20/20 0503 06/21/20 0400 06/22/20 0452  WBC 23.3* 18.8* 15.9* 13.1* 12.3*  NEUTROABS  --   --  11.4*  --   --   HGB 12.0 10.4* 10.4* 10.4* 10.4*  HCT 37.7 32.5* 31.9* 31.6* 31.8*  MCV 93.5 94.2 93.3 90.8 91.1  PLT 528* 324 367 362 340     Recent Results (  from the past 240 hour(s))  Respiratory Panel by RT PCR (Flu A&B, Covid) - Nasopharyngeal Swab     Status: None   Collection Time: 06/18/20  2:44 AM   Specimen: Nasopharyngeal Swab  Result Value Ref Range Status   SARS Coronavirus 2 by RT PCR NEGATIVE NEGATIVE Final    Comment: (NOTE) SARS-CoV-2 target nucleic acids are NOT DETECTED.  The SARS-CoV-2 RNA is generally detectable in upper respiratoy specimens during the acute phase of infection. The lowest concentration of SARS-CoV-2 viral copies this assay can detect is 131 copies/mL. A negative result does not preclude SARS-Cov-2 infection and should not be used as the sole basis for treatment or other patient management decisions. A negative result may occur with  improper specimen collection/handling, submission of specimen other than nasopharyngeal swab, presence of  viral mutation(s) within the areas targeted by this assay, and inadequate number of viral copies (<131 copies/mL). A negative result must be combined with clinical observations, patient history, and epidemiological information. The expected result is Negative.  Fact Sheet for Patients:  PinkCheek.be  Fact Sheet for Healthcare Providers:  GravelBags.it  This test is no t yet approved or cleared by the [place] FDA and  has been authorized for detection and/or diagnosis of SARS-CoV-2 by FDA under an Emergency Use Authorization (EUA). This EUA will remain  in effect (meaning this test can be used) for the duration of the COVID-19 declaration under Section 564(b)(1) of the Act, 21 U.S.C. section 360bbb-3(b)(1), unless the authorization is terminated or revoked sooner.     Influenza A by PCR NEGATIVE NEGATIVE Final   Influenza B by PCR NEGATIVE NEGATIVE Final    Comment: (NOTE) The Xpert Xpress SARS-CoV-2/FLU/RSV assay is intended as an aid in  the diagnosis of influenza from Nasopharyngeal swab specimens and  should not be used as a sole basis for treatment. Nasal washings and  aspirates are unacceptable for Xpert Xpress SARS-CoV-2/FLU/RSV  testing.  Fact Sheet for Patients: PinkCheek.be  Fact Sheet for Healthcare Providers: GravelBags.it  This test is not yet approved or cleared by the [place] FDA and  has been authorized for detection and/or diagnosis of SARS-CoV-2 by  FDA under an Emergency Use Authorization (EUA). This EUA will remain  in effect (meaning this test can be used) for the duration of the  Covid-19 declaration under Section 564(b)(1) of the Act, 21  U.S.C. section 360bbb-3(b)(1), unless the authorization is  terminated or revoked. Performed at [facility], [address] [place], [place] [address]   Gastrointestinal Panel by  PCR , Stool     Status: None   Collection Time: 06/19/20 10:20 AM   Specimen: Stool  Result Value Ref Range Status   Campylobacter species NOT DETECTED NOT DETECTED Final   Plesimonas shigelloides NOT DETECTED NOT DETECTED Final   Salmonella species NOT DETECTED NOT DETECTED Final   Yersinia enterocolitica NOT DETECTED NOT DETECTED Final   Vibrio species NOT DETECTED NOT DETECTED Final   Vibrio cholerae NOT DETECTED NOT DETECTED Final   Enteroaggregative E coli (EAEC) NOT DETECTED NOT DETECTED Final   Enteropathogenic E coli (EPEC) NOT DETECTED NOT DETECTED Final   Enterotoxigenic E coli (ETEC) NOT DETECTED NOT DETECTED Final   Shiga like toxin producing E coli (STEC) NOT DETECTED NOT DETECTED Final   Shigella/Enteroinvasive E coli (EIEC) NOT DETECTED NOT DETECTED Final   Cryptosporidium NOT DETECTED NOT DETECTED Final   Cyclospora cayetanensis NOT DETECTED NOT DETECTED Final   Entamoeba histolytica NOT DETECTED NOT  DETECTED Final   Giardia lamblia NOT DETECTED NOT DETECTED Final   Adenovirus F40/41 NOT DETECTED NOT DETECTED Final   Astrovirus NOT DETECTED NOT DETECTED Final   Norovirus GI/GII NOT DETECTED NOT DETECTED Final   Rotavirus A NOT DETECTED NOT DETECTED Final   Sapovirus (I, II, IV, and V) NOT DETECTED NOT DETECTED Final    Comment: Performed at [facility], [address] [place], [place] [address]  C Difficile Quick Screen w PCR reflex     Status: None   Collection Time: 06/19/20 10:20 AM   Specimen: STOOL  Result Value Ref Range Status   C Diff antigen NEGATIVE NEGATIVE Final   C Diff toxin NEGATIVE NEGATIVE Final   C Diff interpretation No C. difficile detected.  Final    Comment: Performed at [facility], [address] [place], [place] [address]     Studies: [facility] Chest [facility] 1 View  Result Date: 06/23/2020 CLINICAL DATA:  Onset cough yesterday. EXAM: PORTABLE CHEST 1 VIEW COMPARISON:  None. FINDINGS: Lungs clear. Heart size normal. No  pneumothorax or pleural fluid. No acute bony abnormality. IMPRESSION: No acute disease. Electronically Signed   By: Inge Rise M.D.   On: 06/23/2020 10:47    Scheduled Meds: . amLODipine  5 mg Oral Daily  . atorvastatin  20 mg Oral Daily  . cholecalciferol  1,000 Units Oral Daily  . cholestyramine light  4 g Oral BID  . ciprofloxacin  250 mg Oral BID  . enoxaparin (LOVENOX) injection  0.5 mg/kg Subcutaneous Q24H  . fluticasone  2 spray Each Nare Daily  . hydrochlorothiazide  12.5 mg Oral Daily  . lisinopril  40 mg Oral Daily  . loratadine  10 mg Oral Daily  . metroNIDAZOLE  500 mg Oral Q8H  . pantoprazole  40 mg Oral Daily  . sodium bicarbonate  650 mg Oral BID   Continuous Infusions: . sodium chloride 10 mL/hr at 06/22/20 0441    Assessment/Plan:  1. Diarrhea with acute colitis.  On empiric Cipro and Flagyl.  Patient had 2 large bowel movements yesterday and 2 smaller ones documented.  One large one today so far and one smaller one.  Stool comprehensive panel and stool for C. difficile negative.  Continue cholestyramine. 2. 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  General surgery wanted to refer to Dr. Alla Feeling as outpatient.  Patient's daughter will call and set up an appointment. 3. Nausea vomiting last night and cough.  As needed nausea medication given this morning.  Chest x-ray done today does not show any pneumonia. 4. Essential hypertension.  On amlodipine, lisinopril, hydrochlorothiazide 5. GERD.  On Protonix 6. Hyperlipidemia unspecified.  On Lipitor. 7. 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  Local wound care. 8. Acute kidney injury on chronic kidney disease stage III.  Check creatinine again tomorrow. 9. Weakness.  Physical therapy recommending rehab.      Code Status:     Code Status Orders  (From admission, onward)         Start     Ordered   06/18/20 1021  Full code  Continuous        06/18/20 1026         Code Status History    This patient has a current code status but no historical code status.   Advance Care Planning Activity     Family Communication: Spoke with daughter on the phone. Disposition Plan: Status  is: Inpatient  Dispo: The patient is from: Home              Anticipated d/c is to: Rehab              Anticipated d/c date is: Unfortunately no bed offers yet.  Transitional care team working on things.              Patient currently being treated for acute colitis and diarrhea.  Had nausea last night.  Continue to monitor today and reevaluate tomorrow.  Time spent: 27 minutes  [place]

## 2020-06-23 NOTE — Care Management Important Message (Signed)
Important Message  Patient Details  Name: KADRA KOHAN MRN: [id number] Date of Birth: 1941/02/23   Medicare Important Message Given:  Yes     Dannette Barbara 06/23/2020, 11:37 AM

## 2020-06-24 DIAGNOSIS — D72829 Elevated white blood cell count, unspecified: Secondary | ICD-10-CM

## 2020-06-24 DIAGNOSIS — D729 Disorder of white blood cells, unspecified: Secondary | ICD-10-CM

## 2020-06-24 DIAGNOSIS — R197 Diarrhea, unspecified: Secondary | ICD-10-CM | POA: Diagnosis not present

## 2020-06-24 DIAGNOSIS — N179 Acute kidney failure, unspecified: Secondary | ICD-10-CM | POA: Diagnosis not present

## 2020-06-24 DIAGNOSIS — K529 Noninfective gastroenteritis and colitis, unspecified: Secondary | ICD-10-CM | POA: Diagnosis not present

## 2020-06-24 DIAGNOSIS — K828 Other specified diseases of gallbladder: Secondary | ICD-10-CM | POA: Diagnosis not present

## 2020-06-24 LAB — CBC
HCT: 31.3 % — ABNORMAL LOW (ref 36.0–46.0)
Hemoglobin: 10.2 g/dL — ABNORMAL LOW (ref 12.0–15.0)
MCH: 29.7 pg (ref 26.0–34.0)
MCHC: 32.6 g/dL (ref 30.0–36.0)
MCV: 91.3 fL (ref 80.0–100.0)
Platelets: 324 10*3/uL (ref 150–400)
RBC: 3.43 MIL/uL — ABNORMAL LOW (ref 3.87–5.11)
RDW: 14.6 % (ref 11.5–15.5)
WBC: 13.4 10*3/uL — ABNORMAL HIGH (ref 4.0–10.5)
nRBC: 0 % (ref 0.0–0.2)

## 2020-06-24 LAB — BASIC METABOLIC PANEL
Anion gap: 12 (ref 5–15)
BUN: 24 mg/dL — ABNORMAL HIGH (ref 8–23)
CO2: 21 mmol/L — ABNORMAL LOW (ref 22–32)
Calcium: 8.6 mg/dL — ABNORMAL LOW (ref 8.9–10.3)
Chloride: 104 mmol/L (ref 98–111)
Creatinine, Ser: 1.45 mg/dL — ABNORMAL HIGH (ref 0.44–1.00)
GFR, Estimated: 35 mL/min — ABNORMAL LOW (ref >60)
Glucose, Bld: 95 mg/dL (ref 70–99)
Potassium: 3.9 mmol/L (ref 3.5–5.1)
Sodium: 137 mmol/L (ref 135–145)

## 2020-06-24 MED ORDER — CHOLESTYRAMINE LIGHT 4 G PO PACK
4.0000 g | PACK | Freq: Three times a day (TID) | ORAL | Status: DC
  Administered 2020-06-24 – 2020-06-25 (×3): 4 g via ORAL
  Filled 2020-06-24 (×5): qty 1

## 2020-06-24 NOTE — TOC Progression Note (Signed)
Transition of Care [facility]) - Progression Note    Patient Details  Name: Michelle Guerrero MRN: [id number] Date of Birth: November 03, 1940  Transition of Care [facility]) CM/SW [place], RN Phone Number: 06/24/2020, 3:55 PM  Clinical Narrative:   RNCM spoke with patient's daughter [name] at length over lack of available bed offers. [name] was eventually agreeable to extend bed search to [place].  RNCM sent bed request through the hub to [facility] and faxed referral to [facility].     Expected Discharge Plan: [place] Barriers to Discharge: Continued Medical Work up  Expected Discharge Plan and Services Expected Discharge Plan: [place] Choice: [place] arrangements for the past 2 months: Single Family Home                                       Social Determinants of Health (SDOH) Interventions    Readmission Risk Interventions No flowsheet data found.

## 2020-06-24 NOTE — Progress Notes (Signed)
Patient ID: Michelle Guerrero, female   DOB: 04-13-1941, 79 y.o.   MRN: [id number] [facility] PROGRESS NOTE  Michelle Guerrero VQM:[id number] DOB: [birth year] DOA: 06/17/2020 PCP: Nanda Quinton, MD  HPI/Subjective: Patient feeling better today.  No nausea or vomiting.  No cough.  Still having some diarrhea.  Still feeling weak.  Objective: Vitals:   06/24/20 0747 06/24/20 0855  BP: (!) 128/55 111/64  Pulse: 74 80  Resp: 19 17  Temp: 97.9 F (36.6 C) 97.7 F (36.5 C)  SpO2: 97% 99%    Intake/Output Summary (Last 24 hours) at 06/24/2020 1530 Last data filed at 06/24/2020 1429 Gross per 24 hour  Intake 360 ml  Output 950 ml  Net -590 ml   Filed Weights   06/17/20 2153  Weight: 82.3 kg    ROS: Review of Systems  Respiratory: Negative for cough and shortness of breath.   Cardiovascular: Negative for chest pain.  Gastrointestinal: Positive for diarrhea. Negative for abdominal pain, nausea and vomiting.   Exam: Physical Exam HENT:     Head: Normocephalic.     Mouth/Throat:     Pharynx: No oropharyngeal exudate.  Eyes:     General: Lids are normal.     Conjunctiva/sclera: Conjunctivae normal.     Pupils: Pupils are equal, round, and reactive to light.  Cardiovascular:     Rate and Rhythm: Normal rate and regular rhythm.     Heart sounds: Normal heart sounds, S1 normal and S2 normal.  Pulmonary:     Breath sounds: Examination of the right-lower field reveals decreased breath sounds. Examination of the left-lower field reveals decreased breath sounds. Decreased breath sounds present. No wheezing, rhonchi or rales.  Abdominal:     Palpations: Abdomen is soft.     Tenderness: There is no abdominal tenderness.  Musculoskeletal:     Right ankle: Swelling present.     Left ankle: Swelling present.  Skin:    General: Skin is warm.     Findings: No rash.     Comments: Left arm covered Band-Aid covering nose.  Neurological:     Mental Status: She is alert and oriented  to person, place, and time.       Data Reviewed: Basic Metabolic Panel: Recent Labs  Lab 06/17/20 2201 06/19/20 0343 06/20/20 0503 06/21/20 0400 06/24/20 0334  NA 136 134* 137 134* 137  K 3.7 3.8 4.2 4.0 3.9  CL 101 102 105 102 104  CO2 20* 20* 18* 21* 21*  GLUCOSE 100* 84 74 100* 95  BUN 40* 27* 23 25* 24*  CREATININE 1.83* 1.33* 1.35* 1.32* 1.45*  CALCIUM 9.2 8.5* 8.6* 8.6* 8.6*   Liver Function Tests: Recent Labs  Lab 06/17/20 2201  AST 19  ALT 12  ALKPHOS 124  BILITOT 0.8  PROT 8.3*  ALBUMIN 3.5   CBC: Recent Labs  Lab 06/19/20 0343 06/20/20 0503 06/21/20 0400 06/22/20 0452 06/24/20 0334  WBC 18.8* 15.9* 13.1* 12.3* 13.4*  NEUTROABS  --  11.4*  --   --   --   HGB 10.4* 10.4* 10.4* 10.4* 10.2*  HCT 32.5* 31.9* 31.6* 31.8* 31.3*  MCV 94.2 93.3 90.8 91.1 91.3  PLT 324 367 362 340 324     Recent Results (from the past 240 hour(s))  Respiratory Panel by RT PCR (Flu A&B, Covid) - Nasopharyngeal Swab     Status: None   Collection Time: 06/18/20  2:44 AM   Specimen: Nasopharyngeal Swab  Result Value Ref Range Status  SARS Coronavirus 2 by RT PCR NEGATIVE NEGATIVE Final    Comment: (NOTE) SARS-CoV-2 target nucleic acids are NOT DETECTED.  The SARS-CoV-2 RNA is generally detectable in upper respiratoy specimens during the acute phase of infection. The lowest concentration of SARS-CoV-2 viral copies this assay can detect is 131 copies/mL. A negative result does not preclude SARS-Cov-2 infection and should not be used as the sole basis for treatment or other patient management decisions. A negative result may occur with  improper specimen collection/handling, submission of specimen other than nasopharyngeal swab, presence of viral mutation(s) within the areas targeted by this assay, and inadequate number of viral copies (<131 copies/mL). A negative result must be combined with clinical observations, patient history, and epidemiological information.  The expected result is Negative.  Fact Sheet for Patients:  PinkCheek.be  Fact Sheet for Healthcare Providers:  GravelBags.it  This test is no t yet approved or cleared by the [place] FDA and  has been authorized for detection and/or diagnosis of SARS-CoV-2 by FDA under an Emergency Use Authorization (EUA). This EUA will remain  in effect (meaning this test can be used) for the duration of the COVID-19 declaration under Section 564(b)(1) of the Act, 21 U.S.C. section 360bbb-3(b)(1), unless the authorization is terminated or revoked sooner.     Influenza A by PCR NEGATIVE NEGATIVE Final   Influenza B by PCR NEGATIVE NEGATIVE Final    Comment: (NOTE) The Xpert Xpress SARS-CoV-2/FLU/RSV assay is intended as an aid in  the diagnosis of influenza from Nasopharyngeal swab specimens and  should not be used as a sole basis for treatment. Nasal washings and  aspirates are unacceptable for Xpert Xpress SARS-CoV-2/FLU/RSV  testing.  Fact Sheet for Patients: PinkCheek.be  Fact Sheet for Healthcare Providers: GravelBags.it  This test is not yet approved or cleared by the [place] FDA and  has been authorized for detection and/or diagnosis of SARS-CoV-2 by  FDA under an Emergency Use Authorization (EUA). This EUA will remain  in effect (meaning this test can be used) for the duration of the  Covid-19 declaration under Section 564(b)(1) of the Act, 21  U.S.C. section 360bbb-3(b)(1), unless the authorization is  terminated or revoked. Performed at [facility], [address] [place], [place] [address]   Gastrointestinal Panel by PCR , Stool     Status: None   Collection Time: 06/19/20 10:20 AM   Specimen: Stool  Result Value Ref Range Status   Campylobacter species NOT DETECTED NOT DETECTED Final   Plesimonas shigelloides NOT DETECTED NOT DETECTED  Final   Salmonella species NOT DETECTED NOT DETECTED Final   Yersinia enterocolitica NOT DETECTED NOT DETECTED Final   Vibrio species NOT DETECTED NOT DETECTED Final   Vibrio cholerae NOT DETECTED NOT DETECTED Final   Enteroaggregative E coli (EAEC) NOT DETECTED NOT DETECTED Final   Enteropathogenic E coli (EPEC) NOT DETECTED NOT DETECTED Final   Enterotoxigenic E coli (ETEC) NOT DETECTED NOT DETECTED Final   Shiga like toxin producing E coli (STEC) NOT DETECTED NOT DETECTED Final   Shigella/Enteroinvasive E coli (EIEC) NOT DETECTED NOT DETECTED Final   Cryptosporidium NOT DETECTED NOT DETECTED Final   Cyclospora cayetanensis NOT DETECTED NOT DETECTED Final   Entamoeba histolytica NOT DETECTED NOT DETECTED Final   Giardia lamblia NOT DETECTED NOT DETECTED Final   Adenovirus F40/41 NOT DETECTED NOT DETECTED Final   Astrovirus NOT DETECTED NOT DETECTED Final   Norovirus GI/GII NOT DETECTED NOT DETECTED Final   Rotavirus A NOT DETECTED NOT  DETECTED Final   Sapovirus (I, II, IV, and V) NOT DETECTED NOT DETECTED Final    Comment: Performed at [facility], [place], [place] [address]  C Difficile Quick Screen w PCR reflex     Status: None   Collection Time: 06/19/20 10:20 AM   Specimen: STOOL  Result Value Ref Range Status   C Diff antigen NEGATIVE NEGATIVE Final   C Diff toxin NEGATIVE NEGATIVE Final   C Diff interpretation No C. difficile detected.  Final    Comment: Performed at [facility], [address] [place], [place] [address]     Studies: [facility] Chest [facility] 1 View  Result Date: 06/23/2020 CLINICAL DATA:  Onset cough yesterday. EXAM: PORTABLE CHEST 1 VIEW COMPARISON:  None. FINDINGS: Lungs clear. Heart size normal. No pneumothorax or pleural fluid. No acute bony abnormality. IMPRESSION: No acute disease. Electronically Signed   By: Inge Rise M.D.   On: 06/23/2020 10:47    Scheduled Meds: . amLODipine  5 mg Oral Daily  . atorvastatin   20 mg Oral Daily  . cholecalciferol  1,000 Units Oral Daily  . cholestyramine light  4 g Oral BID  . ciprofloxacin  250 mg Oral BID  . enoxaparin (LOVENOX) injection  0.5 mg/kg Subcutaneous Q24H  . fluticasone  2 spray Each Nare Daily  . hydrochlorothiazide  12.5 mg Oral Daily  . lisinopril  40 mg Oral Daily  . loratadine  10 mg Oral Daily  . metroNIDAZOLE  500 mg Oral Q8H  . pantoprazole  40 mg Oral Daily  . sodium bicarbonate  650 mg Oral BID   Continuous Infusions: . sodium chloride 10 mL/hr at 06/22/20 0441    Assessment/Plan:  1. Diarrhea with acute colitis. Continue 5 days of Cipro and Flagyl. Patient still has some diarrhea. Will increase cholestyramine to 3 times a day. Stool comprehensive panel and stool for C. difficile negative. 2. 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 General surgery here did not want to do surgery. They referred to Dr. Barry Dienes as outpatient. Patient's daughter has made that appointment for November 8. 3. Nausea vomiting and cough. This has resolved. 4. Essential hypertension. Continue lisinopril, amlodipine and hydrochlorothiazide 5. Acute kidney injury on chronic kidney disease stage IIIb. Continue to monitor. 6. Leukocytosis. White blood cell count up on presentation. White blood cell count slightly up today at 13.4. Looking back at old white blood cell count at care everywhere section of the chart the patient did have an elevated count of 15 at that time. Could have an underlying CLL. Watch white blood cell counts once off antibiotics. 7. Hyperlipidemia unspecified on Lipitor 8. GERD. Continue Protonix 9. History of skin cancer on her nose and left arm. Local wound care. 10. Weakness. Physical therapy recommended rehab.   Code Status:     Code Status Orders  (From admission, onward)         Start     Ordered   06/18/20 1021  Full code  Continuous        06/18/20 1026        Code Status History    This  patient has a current code status but no historical code status.   Advance Care Planning Activity     Family Communication: Spoke with patient's daughter on the phone Disposition Plan: Status is: Inpatient  Dispo: The patient is from: Home              Anticipated  d/c is to: Rehab              Anticipated d/c date is: No rehab beds available as of yet expanding search as per transitional care team.              Patient currently being treated for diarrhea with acute colitis. Will finish up 5 days of Cipro and Flagyl and need to watch diarrhea and white blood cell count.  Antibiotics:  Cipro  Flagyl  Time spent: 27 minutes  [place]

## 2020-06-25 DIAGNOSIS — K529 Noninfective gastroenteritis and colitis, unspecified: Secondary | ICD-10-CM | POA: Diagnosis not present

## 2020-06-25 MED ORDER — CHOLESTYRAMINE LIGHT 4 G PO PACK
4.0000 g | PACK | Freq: Two times a day (BID) | ORAL | Status: DC
  Administered 2020-06-26: 4 g via ORAL
  Filled 2020-06-25: qty 1

## 2020-06-25 MED ORDER — SALINE SPRAY 0.65 % NA SOLN
1.0000 | NASAL | Status: DC | PRN
  Filled 2020-06-25: qty 44

## 2020-06-25 NOTE — TOC Progression Note (Signed)
Transition of Care [facility]) - Progression Note    Patient Details  Name: Michelle Guerrero MRN: [id number] Date of Birth: [birth year]  Transition of Care [facility]) CM/SW [place], RN Phone Number: 06/25/2020, 10:17 AM  Clinical Narrative:   RNCM reached out to [facility] with [facility] to assess if they can take patient, they will get back in touch.     Expected Discharge Plan: [place] Barriers to Discharge: Continued Medical Work up  Expected Discharge Plan and Services Expected Discharge Plan: [place] Choice: [place] arrangements for the past 2 months: Single Family Home                                       Social Determinants of Health (SDOH) Interventions    Readmission Risk Interventions No flowsheet data found.

## 2020-06-25 NOTE — Progress Notes (Addendum)
Physical Therapy Treatment Patient Details Name: Michelle Guerrero MRN: [id number] DOB: 17-Feb-1941 Today's Date: 06/25/2020    History of Present Illness Michelle Guerrero is a 79 y.o. 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 MD assessment includes acute collitis and abnormal gallbladder ultrasound.    PT Comments    Pt requires encouragement for participation in PT tx. PT educates pt on importance of OOB mobility but pt only willing to sit EOB with encouragement to do so. Pt requires CGA for bed mobility 2/2 LLE weakness & slight pain. Pt tolerates sitting EOB ~3 minutes while performing BLE ankle pumps before pt notes dizziness so returned to supine. (HR=79-100bpm during session, SpO2 = 97% on room air, BP = 110/55 mmHg (RUE)). From supine pt performs LLE SLR with AAROM, BLE hip adduction pillow squeezes, and LLE SAQ's with max multimodal/instrucitonal cuing for technique but demos poor learning. Pt is able to scoot to [facility] with encouragement to attempt and cuing for technique. Pt appears well but when asked, pt endorses ongoing dizziness & nausea at end of session - nurse made aware. Pt would benefit from continued skilled PT treatment to focus on OOB tolerance, BLE strengthening & endurance training - will continue to follow acutely but recommending SNF level of care upon d/c 2/2 current functional deficits.    Follow Up Recommendations  SNF     Equipment Recommendations  None recommended by PT    Recommendations for Other Services       Precautions / Restrictions Precautions Precautions: Fall Precaution Comments: L Hip s/p ORIF approx. 3 weeks ago Restrictions Weight Bearing Restrictions: Yes LLE Weight Bearing: Weight bearing as tolerated     Mobility  Bed Mobility Overal bed mobility: Needs Assistance Bed Mobility: Sit to Supine;Supine to Sit     Supine to sit: Min guard;HOB elevated (LLE) Sit to supine: Min assist;HOB elevated (LLE)      Transfers                    Ambulation/Gait                 Stairs             Wheelchair Mobility    Modified Rankin (Stroke Patients Only)       Balance Overall balance assessment: Needs assistance Sitting-balance support: Feet unsupported;Single extremity supported Sitting balance-Leahy Scale: Good Sitting balance - Comments: Pt is able to sit EOB & perform BLE ankle pumps without LOB                                    Cognition Arousal/Alertness: Awake/alert Behavior During Therapy: WFL for tasks assessed/performed Overall Cognitive Status: No family/caregiver present to determine baseline cognitive functioning                                 General Comments: Pt with poor sustained attention/counting repetitions of exericses      Exercises      General Comments        Pertinent Vitals/Pain Pain Assessment: 0-10 Pain Location: L leg Pain Descriptors / Indicators: Sore Pain Intervention(s): Limited activity  within patient's tolerance;Monitored during session (pt reports she received pain medicaiton prior to tx)    Home Living                      Prior Function            PT Goals (current goals can now be found in the care plan section) Acute Rehab PT Goals Patient Stated Goal: none stated PT Goal Formulation: With patient Time For Goal Achievement: 07/02/20 Potential to Achieve Goals: Fair Progress towards PT goals: Progressing toward goals    Frequency    Min 2X/week      PT Plan Current plan remains appropriate    Co-evaluation              AM-PAC PT "6 Clicks" Mobility   Outcome Measure  Help needed turning from your back to your side while in a flat bed  without using bedrails?: A Little Help needed moving from lying on your back to sitting on the side of a flat bed without using bedrails?: A Little Help needed moving to and from a bed to a chair (including a wheelchair)?: A Lot Help needed standing up from a chair using your arms (e.g., wheelchair or bedside chair)?: A Little Help needed to walk in hospital room?: A Lot Help needed climbing 3-5 steps with a railing? : A Lot 6 Click Score: 15    End of Session   Activity Tolerance: Patient limited by fatigue (pt limited by dizziness/nausea) Patient left: in bed;with call bell/phone within reach;with SCD's reapplied;with bed alarm set, 4 rails up (per pt request) Nurse Communication:  (dizziness/nausea during session) PT Visit Diagnosis: Muscle weakness (generalized) (M62.81);Other abnormalities of gait and mobility (R26.89);History of falling (Z91.81)     Time: 7121-9758 PT Time Calculation (min) (ACUTE ONLY): 24 min  Charges:  $Therapeutic Exercise: 8-22 mins $Therapeutic Activity: 8-22 mins                     Michelle Guerrero, PT, DPT 06/25/20, 10:54 AM    Michelle Guerrero 06/25/2020, 9:49 AM

## 2020-06-25 NOTE — Progress Notes (Signed)
PROGRESS NOTE    AVIGAYIL TON  YBW:[id number] DOB: August 04, 1941 DOA: 06/17/2020 PCP: Nanda Quinton, MD   Brief Narrative:  79 y.o. 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  Patient was discharged from rehab where she was placed after repair of a hip fracture 1 day prior to her hospitalization.  She denies having any fever or chills.  She denies having any sick contacts.  She has no abdominal pain and denies any NSAID use.  No prior history of C. difficile diarrhea or recent antibiotic use.  10/20: Diarrhea resolved.  Patient endorses sinus drainage and fullness today.  Assessment & Plan:   Principal Problem:   Acute colitis Active Problems:   Type 2 diabetes mellitus with microalbuminuria (HCC)   CKD (chronic kidney disease), stage IV (HCC)   Essential hypertension   GERD (gastroesophageal reflux disease)   Acute renal failure superimposed on chronic kidney disease (HCC)   Acute pain of left knee   Weakness   Nausea and vomiting   Leukocytosis  Acute colitis with diarrhea Completed 5 days of Cipro/Flagyl Some persistent diarrhea Responded to cholestyramine 3 times daily Stool GI PCR and C. difficile negative Plan: Continue cholestyramine, dose decreased to 4 mg twice daily  Abnormal MRCP, concern for gallbladder malignancy General surgery saw patient here.  Refer to outpatient Daughter has appointment for November 8  Intractable nausea vomiting Resolved  Essential hypertension Continue lisinopril, amlodipine, hydrochlorothiazide  Acute kidney failure chronic kidney disease stage IIIb Stable Creatinine depression baseline  Persistent leukocytosis Neutrophilic predominance.  White cell count alternates between 12 and 15 Possible  underlying hematologic malignancy Continue to monitor Referral to hematology on discharge  Hyperlipidemia Continue Lipitor  GERD PPI  History of skin cancer on nose and left arm Local wound care Outpatient Derm follow-up  Weakness Functional decline PT evaluation, recommends rehab   DVT prophylaxis: Lovenox Code Status: Full  family Communication: Daughter [name] [phone] on 06/25/2020 Disposition Plan:    Consultants:   none  Procedures:   none  Antimicrobials:  None   Subjective: Seen and examined.    Objective: Vitals:   06/25/20 0042 06/25/20 0805 06/25/20 0914 06/25/20 1528  BP: (!) 109/55 (!) 114/51 (!) 110/55 (!) 110/49  Pulse: 80 78  85  Resp: 15 16  16   Temp: 97.8 F (36.6 C) 97.6 F (36.4 C)  98 F (36.7 C)  TempSrc: Oral Oral  Oral  SpO2: 98% 96%  95%  Weight:      Height:        Intake/Output Summary (Last 24 hours) at 06/25/2020 1644 Last data filed at 06/25/2020 1300 Gross per 24 hour  Intake 240 ml  Output 450 ml  Net -210 ml   Filed Weights   06/17/20 2153  Weight: 82.3 kg    Examination:  General exam: Appears calm and comfortable  Respiratory system: Clear to auscultation. Respiratory effort normal. Cardiovascular system: S1 & S2 heard, RRR. No JVD, murmurs, rubs, gallops or clicks. No pedal edema. Gastrointestinal system: Mild tender to palpation right upper quadrant. Central nervous system: Alert and oriented. No focal neurological deficits. Extremities: Symmetric 5 x 5 power. Skin: No rashes, lesions or ulcers Psychiatry: Judgement and insight appear normal. Mood & affect appropriate.     Data Reviewed: I have personally  reviewed following labs and imaging studies  CBC: Recent Labs  Lab 06/19/20 0343 06/20/20 0503 06/21/20 0400 06/22/20 0452 06/24/20 0334  WBC 18.8* 15.9* 13.1* 12.3* 13.4*  NEUTROABS  --  11.4*  --   --   --   HGB 10.4* 10.4* 10.4* 10.4* 10.2*  HCT 32.5* 31.9* 31.6* 31.8* 31.3*  MCV  94.2 93.3 90.8 91.1 91.3  PLT 324 367 362 340 599   Basic Metabolic Panel: Recent Labs  Lab 06/19/20 0343 06/20/20 0503 06/21/20 0400 06/24/20 0334  NA 134* 137 134* 137  K 3.8 4.2 4.0 3.9  CL 102 105 102 104  CO2 20* 18* 21* 21*  GLUCOSE 84 74 100* 95  BUN 27* 23 25* 24*  CREATININE 1.33* 1.35* 1.32* 1.45*  CALCIUM 8.5* 8.6* 8.6* 8.6*   GFR: Estimated Creatinine Clearance: 32.3 mL/min (A) (by C-G formula based on SCr of 1.45 mg/dL (H)). Liver Function Tests: No results for input(s): AST, ALT, ALKPHOS, BILITOT, PROT, ALBUMIN in the last 168 hours. No results for input(s): LIPASE, AMYLASE in the last 168 hours. No results for input(s): AMMONIA in the last 168 hours. Coagulation Profile: No results for input(s): INR, PROTIME in the last 168 hours. Cardiac Enzymes: No results for input(s): CKTOTAL, CKMB, CKMBINDEX, TROPONINI in the last 168 hours. BNP (last 3 results) No results for input(s): PROBNP in the last 8760 hours. HbA1C: No results for input(s): HGBA1C in the last 72 hours. CBG: No results for input(s): GLUCAP in the last 168 hours. Lipid Profile: No results for input(s): CHOL, HDL, LDLCALC, TRIG, CHOLHDL, LDLDIRECT in the last 72 hours. Thyroid Function Tests: No results for input(s): TSH, T4TOTAL, FREET4, T3FREE, THYROIDAB in the last 72 hours. Anemia Panel: No results for input(s): VITAMINB12, FOLATE, FERRITIN, TIBC, IRON, RETICCTPCT in the last 72 hours. Sepsis Labs: No results for input(s): PROCALCITON, LATICACIDVEN in the last 168 hours.  Recent Results (from the past 240 hour(s))  Respiratory Panel by RT PCR (Flu A&B, Covid) - Nasopharyngeal Swab     Status: None   Collection Time: 06/18/20  2:44 AM   Specimen: Nasopharyngeal Swab  Result Value Ref Range Status   SARS Coronavirus 2 by RT PCR NEGATIVE NEGATIVE Final    Comment: (NOTE) SARS-CoV-2 target nucleic acids are NOT DETECTED.  The SARS-CoV-2 RNA is generally detectable in upper  respiratoy specimens during the acute phase of infection. The lowest concentration of SARS-CoV-2 viral copies this assay can detect is 131 copies/mL. A negative result does not preclude SARS-Cov-2 infection and should not be used as the sole basis for treatment or other patient management decisions. A negative result may occur with  improper specimen collection/handling, submission of specimen other than nasopharyngeal swab, presence of viral mutation(s) within the areas targeted by this assay, and inadequate number of viral copies (<131 copies/mL). A negative result must be combined with clinical observations, patient history, and epidemiological information. The expected result is Negative.  Fact Sheet for Patients:  PinkCheek.be  Fact Sheet for Healthcare Providers:  GravelBags.it  This test is no t yet approved or cleared by the [place] FDA and  has been authorized for detection and/or diagnosis of SARS-CoV-2 by FDA under an Emergency Use Authorization (EUA). This EUA will remain  in effect (meaning this test can be used) for the duration of the COVID-19 declaration under Section 564(b)(1) of the Act, 21 U.S.C. section 360bbb-3(b)(1), unless the authorization is terminated or revoked sooner.     Influenza A by PCR NEGATIVE NEGATIVE  Final   Influenza B by PCR NEGATIVE NEGATIVE Final    Comment: (NOTE) The Xpert Xpress SARS-CoV-2/FLU/RSV assay is intended as an aid in  the diagnosis of influenza from Nasopharyngeal swab specimens and  should not be used as a sole basis for treatment. Nasal washings and  aspirates are unacceptable for Xpert Xpress SARS-CoV-2/FLU/RSV  testing.  Fact Sheet for Patients: PinkCheek.be  Fact Sheet for Healthcare Providers: GravelBags.it  This test is not yet approved or cleared by the [place] FDA and  has been  authorized for detection and/or diagnosis of SARS-CoV-2 by  FDA under an Emergency Use Authorization (EUA). This EUA will remain  in effect (meaning this test can be used) for the duration of the  Covid-19 declaration under Section 564(b)(1) of the Act, 21  U.S.C. section 360bbb-3(b)(1), unless the authorization is  terminated or revoked. Performed at [facility], [address] [place], [place] [address]   Gastrointestinal Panel by PCR , Stool     Status: None   Collection Time: 06/19/20 10:20 AM   Specimen: Stool  Result Value Ref Range Status   Campylobacter species NOT DETECTED NOT DETECTED Final   Plesimonas shigelloides NOT DETECTED NOT DETECTED Final   Salmonella species NOT DETECTED NOT DETECTED Final   Yersinia enterocolitica NOT DETECTED NOT DETECTED Final   Vibrio species NOT DETECTED NOT DETECTED Final   Vibrio cholerae NOT DETECTED NOT DETECTED Final   Enteroaggregative E coli (EAEC) NOT DETECTED NOT DETECTED Final   Enteropathogenic E coli (EPEC) NOT DETECTED NOT DETECTED Final   Enterotoxigenic E coli (ETEC) NOT DETECTED NOT DETECTED Final   Shiga like toxin producing E coli (STEC) NOT DETECTED NOT DETECTED Final   Shigella/Enteroinvasive E coli (EIEC) NOT DETECTED NOT DETECTED Final   Cryptosporidium NOT DETECTED NOT DETECTED Final   Cyclospora cayetanensis NOT DETECTED NOT DETECTED Final   Entamoeba histolytica NOT DETECTED NOT DETECTED Final   Giardia lamblia NOT DETECTED NOT DETECTED Final   Adenovirus F40/41 NOT DETECTED NOT DETECTED Final   Astrovirus NOT DETECTED NOT DETECTED Final   Norovirus GI/GII NOT DETECTED NOT DETECTED Final   Rotavirus A NOT DETECTED NOT DETECTED Final   Sapovirus (I, II, IV, and V) NOT DETECTED NOT DETECTED Final    Comment: Performed at [facility], [address] [place], [place] [address]  C Difficile Quick Screen w PCR reflex     Status: None   Collection Time: 06/19/20 10:20 AM   Specimen: STOOL  Result  Value Ref Range Status   C Diff antigen NEGATIVE NEGATIVE Final   C Diff toxin NEGATIVE NEGATIVE Final   C Diff interpretation No C. difficile detected.  Final    Comment: Performed at [facility], [address], [place], [place] [address]         Radiology Studies: No results found.      Scheduled Meds: . amLODipine  5 mg Oral Daily  . atorvastatin  20 mg Oral Daily  . cholecalciferol  1,000 Units Oral Daily  . cholestyramine light  4 g Oral TID  . enoxaparin (LOVENOX) injection  0.5 mg/kg Subcutaneous Q24H  . fluticasone  2 spray Each Nare Daily  . hydrochlorothiazide  12.5 mg Oral Daily  . lisinopril  40 mg Oral Daily  . loratadine  10 mg Oral Daily  . pantoprazole  40 mg Oral Daily  . sodium bicarbonate  650 mg Oral BID   Continuous Infusions: . sodium chloride 10 mL/hr at 06/22/20  0441     LOS: 7 days    Time spent: 25 minutes    Sidney Ace, MD [facility] Pager [phone]  If 7PM-7AM, please contact night-coverage 06/25/2020, 4:44 PM

## 2020-06-26 DIAGNOSIS — K529 Noninfective gastroenteritis and colitis, unspecified: Secondary | ICD-10-CM | POA: Diagnosis not present

## 2020-06-26 LAB — BASIC METABOLIC PANEL
Anion gap: 10 (ref 5–15)
BUN: 27 mg/dL — ABNORMAL HIGH (ref 8–23)
CO2: 21 mmol/L — ABNORMAL LOW (ref 22–32)
Calcium: 8.6 mg/dL — ABNORMAL LOW (ref 8.9–10.3)
Chloride: 105 mmol/L (ref 98–111)
Creatinine, Ser: 1.62 mg/dL — ABNORMAL HIGH (ref 0.44–1.00)
GFR, Estimated: 30 mL/min — ABNORMAL LOW (ref >60)
Glucose, Bld: 97 mg/dL (ref 70–99)
Potassium: 4.2 mmol/L (ref 3.5–5.1)
Sodium: 136 mmol/L (ref 135–145)

## 2020-06-26 LAB — TROPONIN I (HIGH SENSITIVITY)
Troponin I (High Sensitivity): 8 ng/L (ref <18)
Troponin I (High Sensitivity): 7 ng/L (ref <18)

## 2020-06-26 LAB — MAGNESIUM: Magnesium: 1.5 mg/dL — ABNORMAL LOW (ref 1.7–2.4)

## 2020-06-26 MED ORDER — MAGNESIUM SULFATE 2 GM/50ML IV SOLN
2.0000 g | Freq: Once | INTRAVENOUS | Status: AC
  Administered 2020-06-26: 2 g via INTRAVENOUS
  Filled 2020-06-26: qty 50

## 2020-06-26 NOTE — Progress Notes (Signed)
CCMD called. 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 Sharion Settler made aware. Will cont to monitor pt.

## 2020-06-26 NOTE — TOC Progression Note (Addendum)
Transition of Care [facility]) - Progression Note    Patient Details  Name: Michelle Guerrero MRN: [id number] Date of Birth: 1940/12/21  [facility] CM/SW Contact  Shelbie Ammons, RN Phone Number: 06/26/2020, 8:43 AM  Clinical Narrative:   RNCM reached out to patient's daughter Michelle Guerrero to discuss bed offers of [facility] and [facility]. After some discussion daughter chooses [facility] as it is much closer to her. Discussed that this RNCM will reach out to facility to inform them and so they can start insurance authorization. Michelle Guerrero reports that she will be leaving to go out of town tomorrow morning, encouraged her to reach out to facility this afternoon or tomorrow morning just to touch base with them and make sure there is nothing else they will need.  RNCM left message for Michelle Guerrero at [facility] regarding bed acceptance and for them to start insurance authorization process. Left VM for return call.   1:30pm: Received return call from [place] who reports she has initiated authorization process and will inform when it is received.     Expected Discharge Plan: [place] Barriers to Discharge: Continued Medical Work up  Expected Discharge Plan and Services Expected Discharge Plan: [place] Choice: [place] arrangements for the past 2 months: Single Family Home                                       Social Determinants of Health (SDOH) Interventions    Readmission Risk Interventions No flowsheet data found.

## 2020-06-26 NOTE — Progress Notes (Signed)
Pt transferred to [facility].  All belongings taken.

## 2020-06-26 NOTE — Progress Notes (Signed)
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 No prior EKG in EMR for comparison and admission note reports EKG reviewed as sinus tachycardia.  BMP, magnesium level and troponin ordered at 0258 still pending.  Lab notified of need. Patient asymptomatic of acute symptoms

## 2020-06-26 NOTE — Care Management Important Message (Signed)
Important Message  Patient Details  Name: Michelle Guerrero MRN: [id number] Date of Birth: 1941-02-05   Medicare Important Message Given:  Yes     Juliann Pulse A [place] Hester 06/26/2020, 11:11 AM

## 2020-06-26 NOTE — Plan of Care (Signed)

## 2020-06-26 NOTE — Progress Notes (Signed)
PROGRESS NOTE    Michelle Guerrero  NKN:[id number] DOB: [birth year] DOA: 06/17/2020 PCP: Nanda Quinton, MD   Brief Narrative:  79 y.o. 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  Patient was discharged from rehab where she was placed after repair of a hip fracture 1 day prior to her hospitalization.  She denies having any fever or chills.  She denies having any sick contacts.  She has no abdominal pain and denies any NSAID use.  No prior history of C. difficile diarrhea or recent antibiotic use.  10/20: Diarrhea resolved.  Patient endorses sinus drainage and fullness today. 10/21: No bowel movements over interval.  No complaints this morning  Assessment & Plan:   Principal Problem:   Acute colitis Active Problems:   Type 2 diabetes mellitus with microalbuminuria (HCC)   CKD (chronic kidney disease), stage IV (HCC)   Essential hypertension   GERD (gastroesophageal reflux disease)   Acute renal failure superimposed on chronic kidney disease (HCC)   Acute pain of left knee   Weakness   Nausea and vomiting   Leukocytosis  Acute colitis with diarrhea Completed 5 days of Cipro/Flagyl Some persistent diarrhea Responded to cholestyramine 3 times daily Stool GI PCR and C. difficile negative Diarrhea resolved Plan: Hold cholestyramine Monitor over the next 24 hours.  Patient is able to have formed stools or no bowel movements anticipate medical readiness for discharge on 06/27/2020  Abnormal MRCP, concern for gallbladder malignancy General surgery saw patient here.  Refer to outpatient Daughter has appointment for November 8  Intractable nausea vomiting Resolved  Essential hypertension Continue lisinopril, amlodipine, hydrochlorothiazide  Acute  kidney failure chronic kidney disease stage IIIb Stable Creatinine depression baseline  Persistent leukocytosis Neutrophilic predominance.  White cell count alternates between 12 and 15 Possible underlying hematologic malignancy Continue to monitor Referral to hematology on discharge  Hyperlipidemia Continue Lipitor  GERD PPI  History of skin cancer on nose and left arm Local wound care Outpatient Derm follow-up  Weakness Functional decline PT evaluation, recommends rehab   DVT prophylaxis: Lovenox Code Status: Full  family Communication: Daughter [name] [phone] on 06/25/2020 Disposition Plan: Status is: Inpatient  Remains inpatient appropriate because:Inpatient level of care appropriate due to severity of illness   Dispo: The patient is from: Home              Anticipated d/c is to: SNF              Anticipated d/c date is: 1 day              Patient currently is not medically stable to d/c.   Monitor bowel movements for the next 24 hours off of cholestyramine.  If patient remained stable can discharge to skilled nursing facility as soon as 06/27/2020         Consultants:   none  Procedures:   none  Antimicrobials:  None   Subjective: Seen and examined.  No acute events overnight.  No new complaints.  Diarrhea resolved  Objective: Vitals:   06/25/20 0914 06/25/20 1528 06/25/20 2248 06/26/20 0756  BP: (!) 110/55 (!) 110/49 (!) 95/50 120/69  Pulse:  85 76 81  Resp:  16 18 17   Temp:  98 F (36.7 C)  97.8 F (36.6 C)  TempSrc:  Oral    SpO2:  95% 99% 97%  Weight:      Height:        Intake/Output Summary (Last 24 hours) at 06/26/2020 1244 Last data filed at 06/26/2020 1040 Gross per 24 hour  Intake 480 ml  Output 500 ml  Net -20 ml   Filed Weights   06/17/20 2153  Weight: 82.3 kg    Examination:  General exam: Appears calm and comfortable  Respiratory system: Clear to auscultation. Respiratory effort normal. Cardiovascular  system: S1 & S2 heard, RRR. No JVD, murmurs, rubs, gallops or clicks. No pedal edema. Gastrointestinal system: Mild tender to palpation right upper quadrant. Central nervous system: Alert and oriented. No focal neurological deficits. Extremities: Symmetric 5 x 5 power. Skin: No rashes, lesions or ulcers Psychiatry: Judgement and insight appear normal. Mood & affect appropriate.     Data Reviewed: I have personally reviewed following labs and imaging studies  CBC: Recent Labs  Lab 06/20/20 0503 06/21/20 0400 06/22/20 0452 06/24/20 0334  WBC 15.9* 13.1* 12.3* 13.4*  NEUTROABS 11.4*  --   --   --   HGB 10.4* 10.4* 10.4* 10.2*  HCT 31.9* 31.6* 31.8* 31.3*  MCV 93.3 90.8 91.1 91.3  PLT 367 362 340 382   Basic Metabolic Panel: Recent Labs  Lab 06/20/20 0503 06/21/20 0400 06/24/20 0334 06/26/20 0551  NA 137 134* 137 136  K 4.2 4.0 3.9 4.2  CL 105 102 104 105  CO2 18* 21* 21* 21*  GLUCOSE 74 100* 95 97  BUN 23 25* 24* 27*  CREATININE 1.35* 1.32* 1.45* 1.62*  CALCIUM 8.6* 8.6* 8.6* 8.6*  MG  --   --   --  1.5*   GFR: Estimated Creatinine Clearance: 28.9 mL/min (A) (by C-G formula based on SCr of 1.62 mg/dL (H)). Liver Function Tests: No results for input(s): AST, ALT, ALKPHOS, BILITOT, PROT, ALBUMIN in the last 168 hours. No results for input(s): LIPASE, AMYLASE in the last 168 hours. No results for input(s): AMMONIA in the last 168 hours. Coagulation Profile: No results for input(s): INR, PROTIME in the last 168 hours. Cardiac Enzymes: No results for input(s): CKTOTAL, CKMB, CKMBINDEX, TROPONINI in the last 168 hours. BNP (last 3 results) No results for input(s): PROBNP in the last 8760 hours. HbA1C: No results for input(s): HGBA1C in the last 72 hours. CBG: No results for input(s): GLUCAP in the last 168 hours. Lipid Profile: No results for input(s): CHOL, HDL, LDLCALC, TRIG, CHOLHDL, LDLDIRECT in the last 72 hours. Thyroid Function Tests: No results for  input(s): TSH, T4TOTAL, FREET4, T3FREE, THYROIDAB in the last 72 hours. Anemia Panel: No results for input(s): VITAMINB12, FOLATE, FERRITIN, TIBC, IRON, RETICCTPCT in the last 72 hours. Sepsis Labs: No results for input(s): PROCALCITON, LATICACIDVEN in the last 168 hours.  Recent Results (from the past 240 hour(s))  Respiratory Panel by RT PCR (Flu A&B, Covid) - Nasopharyngeal Swab     Status: None   Collection Time: 06/18/20  2:44 AM   Specimen: Nasopharyngeal Swab  Result Value Ref Range Status   SARS Coronavirus 2 by RT PCR NEGATIVE NEGATIVE Final    Comment: (NOTE) SARS-CoV-2 target nucleic acids are NOT DETECTED.  The SARS-CoV-2 RNA is generally detectable in upper respiratoy specimens during the acute phase of infection. The lowest concentration of SARS-CoV-2 viral copies this assay can detect is 131 copies/mL. A negative result does not preclude SARS-Cov-2 infection and should not be used as the sole basis for treatment or other  patient management decisions. A negative result may occur with  improper specimen collection/handling, submission of specimen other than nasopharyngeal swab, presence of viral mutation(s) within the areas targeted by this assay, and inadequate number of viral copies (<131 copies/mL). A negative result must be combined with clinical observations, patient history, and epidemiological information. The expected result is Negative.  Fact Sheet for Patients:  PinkCheek.be  Fact Sheet for Healthcare Providers:  GravelBags.it  This test is no t yet approved or cleared by the [place] FDA and  has been authorized for detection and/or diagnosis of SARS-CoV-2 by FDA under an Emergency Use Authorization (EUA). This EUA will remain  in effect (meaning this test can be used) for the duration of the COVID-19 declaration under Section 564(b)(1) of the Act, 21 U.S.C. section 360bbb-3(b)(1), unless the  authorization is terminated or revoked sooner.     Influenza A by PCR NEGATIVE NEGATIVE Final   Influenza B by PCR NEGATIVE NEGATIVE Final    Comment: (NOTE) The Xpert Xpress SARS-CoV-2/FLU/RSV assay is intended as an aid in  the diagnosis of influenza from Nasopharyngeal swab specimens and  should not be used as a sole basis for treatment. Nasal washings and  aspirates are unacceptable for Xpert Xpress SARS-CoV-2/FLU/RSV  testing.  Fact Sheet for Patients: PinkCheek.be  Fact Sheet for Healthcare Providers: GravelBags.it  This test is not yet approved or cleared by the [place] FDA and  has been authorized for detection and/or diagnosis of SARS-CoV-2 by  FDA under an Emergency Use Authorization (EUA). This EUA will remain  in effect (meaning this test can be used) for the duration of the  Covid-19 declaration under Section 564(b)(1) of the Act, 21  U.S.C. section 360bbb-3(b)(1), unless the authorization is  terminated or revoked. Performed at [facility], [address] [place], [place] [address]   Gastrointestinal Panel by PCR , Stool     Status: None   Collection Time: 06/19/20 10:20 AM   Specimen: Stool  Result Value Ref Range Status   Campylobacter species NOT DETECTED NOT DETECTED Final   Plesimonas shigelloides NOT DETECTED NOT DETECTED Final   Salmonella species NOT DETECTED NOT DETECTED Final   Yersinia enterocolitica NOT DETECTED NOT DETECTED Final   Vibrio species NOT DETECTED NOT DETECTED Final   Vibrio cholerae NOT DETECTED NOT DETECTED Final   Enteroaggregative E coli (EAEC) NOT DETECTED NOT DETECTED Final   Enteropathogenic E coli (EPEC) NOT DETECTED NOT DETECTED Final   Enterotoxigenic E coli (ETEC) NOT DETECTED NOT DETECTED Final   Shiga like toxin producing E coli (STEC) NOT DETECTED NOT DETECTED Final   Shigella/Enteroinvasive E coli (EIEC) NOT DETECTED NOT DETECTED Final    Cryptosporidium NOT DETECTED NOT DETECTED Final   Cyclospora cayetanensis NOT DETECTED NOT DETECTED Final   Entamoeba histolytica NOT DETECTED NOT DETECTED Final   Giardia lamblia NOT DETECTED NOT DETECTED Final   Adenovirus F40/41 NOT DETECTED NOT DETECTED Final   Astrovirus NOT DETECTED NOT DETECTED Final   Norovirus GI/GII NOT DETECTED NOT DETECTED Final   Rotavirus A NOT DETECTED NOT DETECTED Final   Sapovirus (I, II, IV, and V) NOT DETECTED NOT DETECTED Final    Comment: Performed at [facility], [address] [place], [place] [address]  C Difficile Quick Screen w PCR reflex     Status: None   Collection Time: 06/19/20 10:20 AM   Specimen: STOOL  Result Value Ref Range Status   C Diff antigen NEGATIVE NEGATIVE Final   C  Diff toxin NEGATIVE NEGATIVE Final   C Diff interpretation No C. difficile detected.  Final    Comment: Performed at [facility], [address], [place], [place] [address]         Radiology Studies: No results found.      Scheduled Meds:  amLODipine  5 mg Oral Daily   atorvastatin  20 mg Oral Daily   cholecalciferol  1,000 Units Oral Daily   enoxaparin (LOVENOX) injection  0.5 mg/kg Subcutaneous Q24H   fluticasone  2 spray Each Nare Daily   hydrochlorothiazide  12.5 mg Oral Daily   lisinopril  40 mg Oral Daily   loratadine  10 mg Oral Daily   pantoprazole  40 mg Oral Daily   sodium bicarbonate  650 mg Oral BID   Continuous Infusions:  sodium chloride 10 mL/hr at 06/22/20 0441     LOS: 8 days    Time spent: 15 minutes    Sidney Ace, MD [facility] Pager [phone]  If 7PM-7AM, please contact night-coverage 06/26/2020, 12:44 PM

## 2020-06-27 DIAGNOSIS — K529 Noninfective gastroenteritis and colitis, unspecified: Secondary | ICD-10-CM | POA: Diagnosis not present

## 2020-06-27 LAB — MAGNESIUM: Magnesium: 2.1 mg/dL (ref 1.7–2.4)

## 2020-06-27 NOTE — Progress Notes (Signed)
PROGRESS NOTE    Michelle Guerrero  JYN:[id number] DOB: 10/08/1940 DOA: 06/17/2020 PCP: Nanda Quinton, MD   Brief Narrative:  79 y.o. 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  Patient was discharged from rehab where she was placed after repair of a hip fracture 1 day prior to her hospitalization.  She denies having any fever or chills.  She denies having any sick contacts.  She has no abdominal pain and denies any NSAID use.  No prior history of C. difficile diarrhea or recent antibiotic use.  10/20: Diarrhea resolved.  Patient endorses sinus drainage and fullness today. 10/21: No bowel movements over interval.  No complaints this morning. 10/22: Questran discontinued.  Patient having formed stools   Assessment & Plan:   Principal Problem:   Acute colitis Active Problems:   Type 2 diabetes mellitus with microalbuminuria ([place])   CKD (chronic kidney disease), stage IV (HCC)   Essential hypertension   GERD (gastroesophageal reflux disease)   Acute renal failure superimposed on chronic kidney disease (HCC)   Acute pain of left knee   Weakness   Nausea and vomiting   Leukocytosis  Acute colitis with diarrhea Completed 5 days of Cipro/Flagyl Some persistent diarrhea Responded to cholestyramine 3 times daily Stool GI PCR and C. difficile negative Diarrhea resolved 06/27/2020, stools becoming formed Plan: No indication for cholestyramine  Abnormal MRCP, concern for gallbladder malignancy General surgery saw patient here.  Refer to outpatient Daughter has appointment for November 8  Intractable nausea vomiting Resolved  Essential hypertension Continue lisinopril, amlodipine, hydrochlorothiazide  Acute kidney failure chronic kidney disease  stage IIIb Stable Creatinine depression baseline  Persistent leukocytosis Neutrophilic predominance.  White cell count alternates between 12 and 15 Possible underlying hematologic malignancy Continue to monitor Referral to hematology on discharge  Hyperlipidemia Continue Lipitor  GERD PPI  History of skin cancer on nose and left arm Local wound care Outpatient Derm follow-up  Weakness Functional decline PT evaluation, recommends rehab   DVT prophylaxis: Lovenox Code Status: Full  family Communication: Daughter [name] [phone] on 06/27/2020 Disposition Plan: Status is: Inpatient  Remains inpatient appropriate because:Unsafe d/c plan   Dispo: The patient is from: Home              Anticipated d/c is to: SNF              Anticipated d/c date is: 1 day              Patient currently is medically stable to d/c.   Patient currently medically stable for discharge.  SNF bed found in [place].  Pending insurance authorization.  Once authorization obtained patient can discharge.   Consultants:   none  Procedures:   none  Antimicrobials:  None   Subjective: Seen and examined.  Diarrhea resolved.  Patient now having formed stools.  No pain complaints.  Objective: Vitals:   06/27/20 0013 06/27/20 0454 06/27/20 0855 06/27/20 0954  BP: 135/71 (!) 113/52 (!) 120/52 (!) 109/54  Pulse: 84 75 72 79  Resp: 15 16 16 18   Temp: 97.7 F (36.5 C) 97.7 F (36.5 C) 97.6 F (36.4 C) (!) 97.5 F (36.4 C)  TempSrc:  Oral Oral Oral  SpO2: 100% 98% 97% 98%  Weight:      Height:  Intake/Output Summary (Last 24 hours) at 06/27/2020 1054 Last data filed at 06/27/2020 1044 Gross per 24 hour  Intake 356.63 ml  Output 200 ml  Net 156.63 ml   Filed Weights   06/17/20 2153  Weight: 82.3 kg    Examination:  General exam: Appears calm and comfortable  Respiratory system: Clear to auscultation. Respiratory effort normal. Cardiovascular system: S1 & S2 heard,  RRR. No JVD, murmurs, rubs, gallops or clicks. No pedal edema. Gastrointestinal system: Soft nontender, nondistended, normal bowel sounds  Central nervous system: Alert and oriented. No focal neurological deficits. Extremities: Symmetric 5 x 5 power. Skin: Facial lesions consistent with known history of skin cancer Psychiatry: Judgement and insight appear normal. Mood & affect appropriate.     Data Reviewed: I have personally reviewed following labs and imaging studies  CBC: Recent Labs  Lab 06/21/20 0400 06/22/20 0452 06/24/20 0334  WBC 13.1* 12.3* 13.4*  HGB 10.4* 10.4* 10.2*  HCT 31.6* 31.8* 31.3*  MCV 90.8 91.1 91.3  PLT 362 340 371   Basic Metabolic Panel: Recent Labs  Lab 06/21/20 0400 06/24/20 0334 06/26/20 0551 06/27/20 0552  NA 134* 137 136  --   K 4.0 3.9 4.2  --   CL 102 104 105  --   CO2 21* 21* 21*  --   GLUCOSE 100* 95 97  --   BUN 25* 24* 27*  --   CREATININE 1.32* 1.45* 1.62*  --   CALCIUM 8.6* 8.6* 8.6*  --   MG  --   --  1.5* 2.1   GFR: Estimated Creatinine Clearance: 28.9 mL/min (A) (by C-G formula based on SCr of 1.62 mg/dL (H)). Liver Function Tests: No results for input(s): AST, ALT, ALKPHOS, BILITOT, PROT, ALBUMIN in the last 168 hours. No results for input(s): LIPASE, AMYLASE in the last 168 hours. No results for input(s): AMMONIA in the last 168 hours. Coagulation Profile: No results for input(s): INR, PROTIME in the last 168 hours. Cardiac Enzymes: No results for input(s): CKTOTAL, CKMB, CKMBINDEX, TROPONINI in the last 168 hours. BNP (last 3 results) No results for input(s): PROBNP in the last 8760 hours. HbA1C: No results for input(s): HGBA1C in the last 72 hours. CBG: No results for input(s): GLUCAP in the last 168 hours. Lipid Profile: No results for input(s): CHOL, HDL, LDLCALC, TRIG, CHOLHDL, LDLDIRECT in the last 72 hours. Thyroid Function Tests: No results for input(s): TSH, T4TOTAL, FREET4, T3FREE, THYROIDAB in the last 72  hours. Anemia Panel: No results for input(s): VITAMINB12, FOLATE, FERRITIN, TIBC, IRON, RETICCTPCT in the last 72 hours. Sepsis Labs: No results for input(s): PROCALCITON, LATICACIDVEN in the last 168 hours.  Recent Results (from the past 240 hour(s))  Respiratory Panel by RT PCR (Flu A&B, Covid) - Nasopharyngeal Swab     Status: None   Collection Time: 06/18/20  2:44 AM   Specimen: Nasopharyngeal Swab  Result Value Ref Range Status   SARS Coronavirus 2 by RT PCR NEGATIVE NEGATIVE Final    Comment: (NOTE) SARS-CoV-2 target nucleic acids are NOT DETECTED.  The SARS-CoV-2 RNA is generally detectable in upper respiratoy specimens during the acute phase of infection. The lowest concentration of SARS-CoV-2 viral copies this assay can detect is 131 copies/mL. A negative result does not preclude SARS-Cov-2 infection and should not be used as the sole basis for treatment or other patient management decisions. A negative result may occur with  improper specimen collection/handling, submission of specimen other than nasopharyngeal swab, presence of viral mutation(s) within  the areas targeted by this assay, and inadequate number of viral copies (<131 copies/mL). A negative result must be combined with clinical observations, patient history, and epidemiological information. The expected result is Negative.  Fact Sheet for Patients:  PinkCheek.be  Fact Sheet for Healthcare Providers:  GravelBags.it  This test is no t yet approved or cleared by the [place] FDA and  has been authorized for detection and/or diagnosis of SARS-CoV-2 by FDA under an Emergency Use Authorization (EUA). This EUA will remain  in effect (meaning this test can be used) for the duration of the COVID-19 declaration under Section 564(b)(1) of the Act, 21 U.S.C. section 360bbb-3(b)(1), unless the authorization is terminated or revoked sooner.     Influenza  A by PCR NEGATIVE NEGATIVE Final   Influenza B by PCR NEGATIVE NEGATIVE Final    Comment: (NOTE) The Xpert Xpress SARS-CoV-2/FLU/RSV assay is intended as an aid in  the diagnosis of influenza from Nasopharyngeal swab specimens and  should not be used as a sole basis for treatment. Nasal washings and  aspirates are unacceptable for Xpert Xpress SARS-CoV-2/FLU/RSV  testing.  Fact Sheet for Patients: PinkCheek.be  Fact Sheet for Healthcare Providers: GravelBags.it  This test is not yet approved or cleared by the [place] FDA and  has been authorized for detection and/or diagnosis of SARS-CoV-2 by  FDA under an Emergency Use Authorization (EUA). This EUA will remain  in effect (meaning this test can be used) for the duration of the  Covid-19 declaration under Section 564(b)(1) of the Act, 21  U.S.C. section 360bbb-3(b)(1), unless the authorization is  terminated or revoked. Performed at [facility], [address] [place], [place] [address]   Gastrointestinal Panel by PCR , Stool     Status: None   Collection Time: 06/19/20 10:20 AM   Specimen: Stool  Result Value Ref Range Status   Campylobacter species NOT DETECTED NOT DETECTED Final   Plesimonas shigelloides NOT DETECTED NOT DETECTED Final   Salmonella species NOT DETECTED NOT DETECTED Final   Yersinia enterocolitica NOT DETECTED NOT DETECTED Final   Vibrio species NOT DETECTED NOT DETECTED Final   Vibrio cholerae NOT DETECTED NOT DETECTED Final   Enteroaggregative E coli (EAEC) NOT DETECTED NOT DETECTED Final   Enteropathogenic E coli (EPEC) NOT DETECTED NOT DETECTED Final   Enterotoxigenic E coli (ETEC) NOT DETECTED NOT DETECTED Final   Shiga like toxin producing E coli (STEC) NOT DETECTED NOT DETECTED Final   Shigella/Enteroinvasive E coli (EIEC) NOT DETECTED NOT DETECTED Final   Cryptosporidium NOT DETECTED NOT DETECTED Final   Cyclospora  cayetanensis NOT DETECTED NOT DETECTED Final   Entamoeba histolytica NOT DETECTED NOT DETECTED Final   Giardia lamblia NOT DETECTED NOT DETECTED Final   Adenovirus F40/41 NOT DETECTED NOT DETECTED Final   Astrovirus NOT DETECTED NOT DETECTED Final   Norovirus GI/GII NOT DETECTED NOT DETECTED Final   Rotavirus A NOT DETECTED NOT DETECTED Final   Sapovirus (I, II, IV, and V) NOT DETECTED NOT DETECTED Final    Comment: Performed at [facility], [address] [place], [place] [address]  C Difficile Quick Screen w PCR reflex     Status: None   Collection Time: 06/19/20 10:20 AM   Specimen: STOOL  Result Value Ref Range Status   C Diff antigen NEGATIVE NEGATIVE Final   C Diff toxin NEGATIVE NEGATIVE Final   C Diff interpretation No C. difficile detected.  Final    Comment: Performed at [facility],  [place], [place] [address]         Radiology Studies: No results found.      Scheduled Meds: . amLODipine  5 mg Oral Daily  . atorvastatin  20 mg Oral Daily  . cholecalciferol  1,000 Units Oral Daily  . enoxaparin (LOVENOX) injection  0.5 mg/kg Subcutaneous Q24H  . fluticasone  2 spray Each Nare Daily  . hydrochlorothiazide  12.5 mg Oral Daily  . lisinopril  40 mg Oral Daily  . loratadine  10 mg Oral Daily  . pantoprazole  40 mg Oral Daily  . sodium bicarbonate  650 mg Oral BID   Continuous Infusions: . sodium chloride Stopped (06/26/20 1640)     LOS: 9 days    Time spent: 15 minutes    Sidney Ace, MD [facility] Pager [phone]  If 7PM-7AM, please contact night-coverage 06/27/2020, 10:54 AM

## 2020-06-27 NOTE — Progress Notes (Signed)
Physical Therapy Treatment Patient Details Name: Michelle Guerrero MRN: [id number] DOB: [birth year] Today's Date: 06/27/2020    History of Present Illness Michelle Guerrero is a 79 y.o. 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 MD assessment includes acute collitis and abnormal gallbladder ultrasound.    PT Comments    Pt reports continues nausea today and is fearful over mobility thinking she will vomit if she tries.  She does agree to supine ex and seems generally comfortable doing so.  She is inc large soft BM.  Rolling left and right with rail and is able to hold position without assist.  Continues to decline mobility.   Follow Up Recommendations  SNF     Equipment Recommendations       Recommendations for Other Services       Precautions / Restrictions Precautions Precautions: Fall Precaution Comments: L Hip s/p ORIF approx. 3 weeks ago Restrictions Weight Bearing Restrictions: Yes RLE Weight Bearing: Weight bearing as tolerated LLE Weight Bearing: Weight bearing as tolerated    Mobility  Bed Mobility Overal bed mobility: Needs Assistance Bed Mobility: Rolling Rolling: Supervision         General bed mobility comments: uses rails but rolls with ease  Transfers                 General transfer comment: pt declined secondary to feeling nauseous  Ambulation/Gait                 Stairs             Wheelchair Mobility    Modified Rankin (Stroke Patients Only)       Balance                                            Cognition Arousal/Alertness: Awake/alert Behavior During Therapy: WFL for tasks assessed/performed Overall Cognitive Status: Within Functional Limits for tasks  assessed                                        Exercises Total Joint Exercises Ankle Circles/Pumps: AROM;10 reps;Both Quad Sets: AROM;Both;10 reps Gluteal Sets: AROM;Both;10 reps Long Arc Quad: AROM;Both;10 reps    General Comments        Pertinent Vitals/Pain Pain Assessment: No/denies pain    Home Living                      Prior Function            PT Goals (current goals can now be found in the care plan section) Progress towards PT goals: Not progressing toward goals - comment    Frequency    Min 2X/week      PT Plan Current plan remains appropriate    Co-evaluation              AM-PAC PT "6 Clicks" Mobility   Outcome Measure  Help needed turning from your back to your side while in a flat bed without using bedrails?: A Little Help needed moving from lying on your back  to sitting on the side of a flat bed without using bedrails?: A Little Help needed moving to and from a bed to a chair (including a wheelchair)?: A Lot Help needed standing up from a chair using your arms (e.g., wheelchair or bedside chair)?: A Little Help needed to walk in hospital room?: A Lot Help needed climbing 3-5 steps with a railing? : A Lot 6 Click Score: 15    End of Session   Activity Tolerance: Other (comment) Patient left: in bed;with call bell/phone within reach;with SCD's reapplied;with bed alarm set Nurse Communication: Mobility status;Other (comment) PT Visit Diagnosis: Muscle weakness (generalized) (M62.81);Other abnormalities of gait and mobility (R26.89);History of falling (Z91.81)     Time: 2957-4734 PT Time Calculation (min) (ACUTE ONLY): 17 min  Charges:  $Therapeutic Exercise: 8-22 mins                    Chesley Noon, PTA 06/27/20, 12:55 PM

## 2020-06-28 DIAGNOSIS — K529 Noninfective gastroenteritis and colitis, unspecified: Secondary | ICD-10-CM | POA: Diagnosis not present

## 2020-06-28 NOTE — Progress Notes (Signed)
PROGRESS NOTE    AERICA Guerrero  MPN:[id number] DOB: August 28, 1941 DOA: 06/17/2020 PCP: Nanda Quinton, MD   Brief Narrative:  79 y.o. 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  Patient was discharged from rehab where she was placed after repair of a hip fracture 1 day prior to her hospitalization.  She denies having any fever or chills.  She denies having any sick contacts.  She has no abdominal pain and denies any NSAID use.  No prior history of C. difficile diarrhea or recent antibiotic use.  10/20: Diarrhea resolved.  Patient endorses sinus drainage and fullness today. 10/21: No bowel movements over interval.  No complaints this morning. 10/22: Questran discontinued.  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 difficile negative Diarrhea resolved 06/27/2020, stools becoming formed Plan: No indication for cholestyramine  Abnormal MRCP, concern for gallbladder malignancy General surgery saw patient here.  Refer to outpatient Daughter has appointment for November 8  Intractable nausea vomiting Resolved  Essential hypertension Continue lisinopril, amlodipine, hydrochlorothiazide  Acute kidney failure chronic kidney disease  stage IIIb Stable Creatinine depression baseline  Persistent leukocytosis Neutrophilic predominance.  White cell count alternates between 12 and 15 Possible underlying hematologic malignancy Continue to monitor Referral to hematology on discharge  Hyperlipidemia Continue Lipitor  GERD PPI  History of skin cancer on nose and left arm Local wound care Outpatient Derm follow-up  Weakness Functional decline PT evaluation, recommends rehab   DVT prophylaxis: Lovenox Code Status: Full  family Communication: Daughter [name] [phone] on 06/27/2020 Disposition Plan: Status is: Inpatient  Remains inpatient appropriate because:Unsafe d/c plan   Dispo: The patient is from: Home              Anticipated d/c is to: SNF              Anticipated d/c date is: 1 day              Patient currently is medically stable to d/c.   Patient currently medically stable for discharge.  Pending insurance authorization for skilled nursing facility bed in [place].  Once insurance authorization obtained patient can be discharged   Consultants:   none  Procedures:   none  Antimicrobials:  None   Subjective: Seen and examined.  No complaints.  Diarrhea resolved. Objective: Vitals:   06/27/20 1934 06/27/20 2348 06/28/20 0337 06/28/20 0821  BP: 132/65 123/67 140/75 (!) 134/53  Pulse: 97 78 73 70  Resp: 20 18 20 20   Temp: 97.7 F (36.5 C) 97.6 F (36.4 C) 97.7 F (36.5 C) 97.7 F (36.5 C)  TempSrc: Oral Oral Oral Oral  SpO2: 97% 98% 99% 98%  Weight:      Height:        Intake/Output Summary (  Last 24 hours) at 06/28/2020 1114 Last data filed at 06/28/2020 0431 Gross per 24 hour  Intake 489.05 ml  Output 600 ml  Net -110.95 ml   Filed Weights   06/17/20 2153  Weight: 82.3 kg    Examination:  General exam: Appears calm and comfortable  Respiratory system: Clear to auscultation. Respiratory effort normal. Cardiovascular system: S1 & S2 heard, RRR. No JVD, murmurs,  rubs, gallops or clicks. No pedal edema. Gastrointestinal system: Soft nontender, nondistended, normal bowel sounds  Central nervous system: Alert and oriented. No focal neurological deficits. Extremities: Symmetric 5 x 5 power. Skin: Facial lesions consistent with known history of skin cancer Psychiatry: Judgement and insight appear normal. Mood & affect appropriate.     Data Reviewed: I have personally reviewed following labs and imaging studies  CBC: Recent Labs  Lab 06/22/20 0452 06/24/20 0334  WBC 12.3* 13.4*  HGB 10.4* 10.2*  HCT 31.8* 31.3*  MCV 91.1 91.3  PLT 340 818   Basic Metabolic Panel: Recent Labs  Lab 06/24/20 0334 06/26/20 0551 06/27/20 0552  NA 137 136  --   K 3.9 4.2  --   CL 104 105  --   CO2 21* 21*  --   GLUCOSE 95 97  --   BUN 24* 27*  --   CREATININE 1.45* 1.62*  --   CALCIUM 8.6* 8.6*  --   MG  --  1.5* 2.1   GFR: Estimated Creatinine Clearance: 28.9 mL/min (A) (by C-G formula based on SCr of 1.62 mg/dL (H)). Liver Function Tests: No results for input(s): AST, ALT, ALKPHOS, BILITOT, PROT, ALBUMIN in the last 168 hours. No results for input(s): LIPASE, AMYLASE in the last 168 hours. No results for input(s): AMMONIA in the last 168 hours. Coagulation Profile: No results for input(s): INR, PROTIME in the last 168 hours. Cardiac Enzymes: No results for input(s): CKTOTAL, CKMB, CKMBINDEX, TROPONINI in the last 168 hours. BNP (last 3 results) No results for input(s): PROBNP in the last 8760 hours. HbA1C: No results for input(s): HGBA1C in the last 72 hours. CBG: No results for input(s): GLUCAP in the last 168 hours. Lipid Profile: No results for input(s): CHOL, HDL, LDLCALC, TRIG, CHOLHDL, LDLDIRECT in the last 72 hours. Thyroid Function Tests: No results for input(s): TSH, T4TOTAL, FREET4, T3FREE, THYROIDAB in the last 72 hours. Anemia Panel: No results for input(s): VITAMINB12, FOLATE, FERRITIN, TIBC, IRON, RETICCTPCT in the last 72  hours. Sepsis Labs: No results for input(s): PROCALCITON, LATICACIDVEN in the last 168 hours.  Recent Results (from the past 240 hour(s))  Gastrointestinal Panel by PCR , Stool     Status: None   Collection Time: 06/19/20 10:20 AM   Specimen: Stool  Result Value Ref Range Status   Campylobacter species NOT DETECTED NOT DETECTED Final   Plesimonas shigelloides NOT DETECTED NOT DETECTED Final   Salmonella species NOT DETECTED NOT DETECTED Final   Yersinia enterocolitica NOT DETECTED NOT DETECTED Final   Vibrio species NOT DETECTED NOT DETECTED Final   Vibrio cholerae NOT DETECTED NOT DETECTED Final   Enteroaggregative E coli (EAEC) NOT DETECTED NOT DETECTED Final   Enteropathogenic E coli (EPEC) NOT DETECTED NOT DETECTED Final   Enterotoxigenic E coli (ETEC) NOT DETECTED NOT DETECTED Final   Shiga like toxin producing E coli (STEC) NOT DETECTED NOT DETECTED Final   Shigella/Enteroinvasive E coli (EIEC) NOT DETECTED NOT DETECTED Final   Cryptosporidium NOT DETECTED NOT DETECTED Final   Cyclospora cayetanensis NOT DETECTED NOT DETECTED Final  Entamoeba histolytica NOT DETECTED NOT DETECTED Final   Giardia lamblia NOT DETECTED NOT DETECTED Final   Adenovirus F40/41 NOT DETECTED NOT DETECTED Final   Astrovirus NOT DETECTED NOT DETECTED Final   Norovirus GI/GII NOT DETECTED NOT DETECTED Final   Rotavirus A NOT DETECTED NOT DETECTED Final   Sapovirus (I, II, IV, and V) NOT DETECTED NOT DETECTED Final    Comment: Performed at [facility], [address] [place], [place] [address]  C Difficile Quick Screen w PCR reflex     Status: None   Collection Time: 06/19/20 10:20 AM   Specimen: STOOL  Result Value Ref Range Status   C Diff antigen NEGATIVE NEGATIVE Final   C Diff toxin NEGATIVE NEGATIVE Final   C Diff interpretation No C. difficile detected.  Final    Comment: Performed at [facility], [address], [place], [place] [address]         Radiology  Studies: No results found.      Scheduled Meds: . amLODipine  5 mg Oral Daily  . atorvastatin  20 mg Oral Daily  . cholecalciferol  1,000 Units Oral Daily  . enoxaparin (LOVENOX) injection  0.5 mg/kg Subcutaneous Q24H  . fluticasone  2 spray Each Nare Daily  . hydrochlorothiazide  12.5 mg Oral Daily  . lisinopril  40 mg Oral Daily  . loratadine  10 mg Oral Daily  . pantoprazole  40 mg Oral Daily  . sodium bicarbonate  650 mg Oral BID   Continuous Infusions: . sodium chloride Stopped (06/26/20 1640)     LOS: 10 days    Time spent: 15 minutes    Sidney Ace, MD [facility] Pager [phone]  If 7PM-7AM, please contact night-coverage 06/28/2020, 11:14 AM

## 2020-06-29 DIAGNOSIS — K529 Noninfective gastroenteritis and colitis, unspecified: Secondary | ICD-10-CM | POA: Diagnosis not present

## 2020-06-29 NOTE — Progress Notes (Signed)
PROGRESS NOTE    Michelle Guerrero  QPR:[id number] DOB: August 22, 1941 DOA: 06/17/2020 PCP: Nanda Quinton, MD   Brief Narrative:  79 y.o. 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  Patient was discharged from rehab where she was placed after repair of a hip fracture 1 day prior to her hospitalization.  She denies having any fever or chills.  She denies having any sick contacts.  She has no abdominal pain and denies any NSAID use.  No prior history of C. difficile diarrhea or recent antibiotic use.  10/20: Diarrhea resolved.  Patient endorses sinus drainage and fullness today. 10/21: No bowel movements over interval.  No complaints this morning. 10/22: Questran discontinued.  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 difficile negative Diarrhea resolved 06/27/2020, stools becoming formed Plan: No indication for cholestyramine  Abnormal MRCP, concern for gallbladder malignancy General surgery saw patient here.  Refer to outpatient Daughter has appointment for November 8  Intractable nausea vomiting Resolved  Essential hypertension Continue lisinopril, amlodipine, hydrochlorothiazide  Acute kidney failure chronic kidney disease  stage IIIb Stable Creatinine depression baseline  Persistent leukocytosis Neutrophilic predominance.  White cell count alternates between 12 and 15 Possible underlying hematologic malignancy Continue to monitor Referral to hematology on discharge  Hyperlipidemia Continue Lipitor  GERD PPI  History of skin cancer on nose and left arm Local wound care Outpatient Derm follow-up  Weakness Functional decline PT evaluation, recommends rehab   DVT prophylaxis: Lovenox Code Status: Full  family Communication: Daughter [name] [phone] on 06/27/2020 Disposition Plan: Status is: Inpatient  Remains inpatient appropriate because:Unsafe d/c plan   Dispo: The patient is from: Home              Anticipated d/c is to: SNF              Anticipated d/c date is: 1 day              Patient currently is medically stable to d/c.   Patient currently medically stable for discharge.  Pending insurance authorization for skilled nursing facility bed in [place].  Once insurance authorization obtained patient can be discharged   Consultants:   none  Procedures:   none  Antimicrobials:  None   Subjective: Seen and examined.  No complaints.  Diarrhea resolved.  Objective: Vitals:   06/28/20 1918 06/28/20 2326 06/29/20 0435 06/29/20 0755  BP: (!) 132/57 120/64 (!) 101/48 (!) 128/50  Pulse: 87 80 73 67  Resp: 20 20 16 20   Temp: 97.7 F (36.5 C) 97.8 F (36.6 C) 97.7 F (36.5 C) 97.6 F (36.4 C)  TempSrc: Oral  Oral Oral  SpO2: 97% 96% 97% 99%  Weight:      Height:  Intake/Output Summary (Last 24 hours) at 06/29/2020 1102 Last data filed at 06/29/2020 0900 Gross per 24 hour  Intake 240 ml  Output --  Net 240 ml   Filed Weights   06/17/20 2153  Weight: 82.3 kg    Examination:  General exam: Appears calm and comfortable  Respiratory system: Clear to auscultation. Respiratory effort normal. Cardiovascular system: S1 & S2 heard, RRR. No JVD, murmurs,  rubs, gallops or clicks. No pedal edema. Gastrointestinal system: Soft nontender, nondistended, normal bowel sounds  Central nervous system: Alert and oriented. No focal neurological deficits. Extremities: Symmetric 5 x 5 power. Skin: Facial lesions consistent with known history of skin cancer Psychiatry: Judgement and insight appear normal. Mood & affect appropriate.     Data Reviewed: I have personally reviewed following labs and imaging studies  CBC: Recent Labs  Lab 06/24/20 0334  WBC 13.4*  HGB 10.2*  HCT 31.3*  MCV 91.3  PLT 622   Basic Metabolic Panel: Recent Labs  Lab 06/24/20 0334 06/26/20 0551 06/27/20 0552  NA 137 136  --   K 3.9 4.2  --   CL 104 105  --   CO2 21* 21*  --   GLUCOSE 95 97  --   BUN 24* 27*  --   CREATININE 1.45* 1.62*  --   CALCIUM 8.6* 8.6*  --   MG  --  1.5* 2.1   GFR: Estimated Creatinine Clearance: 28.9 mL/min (A) (by C-G formula based on SCr of 1.62 mg/dL (H)). Liver Function Tests: No results for input(s): AST, ALT, ALKPHOS, BILITOT, PROT, ALBUMIN in the last 168 hours. No results for input(s): LIPASE, AMYLASE in the last 168 hours. No results for input(s): AMMONIA in the last 168 hours. Coagulation Profile: No results for input(s): INR, PROTIME in the last 168 hours. Cardiac Enzymes: No results for input(s): CKTOTAL, CKMB, CKMBINDEX, TROPONINI in the last 168 hours. BNP (last 3 results) No results for input(s): PROBNP in the last 8760 hours. HbA1C: No results for input(s): HGBA1C in the last 72 hours. CBG: No results for input(s): GLUCAP in the last 168 hours. Lipid Profile: No results for input(s): CHOL, HDL, LDLCALC, TRIG, CHOLHDL, LDLDIRECT in the last 72 hours. Thyroid Function Tests: No results for input(s): TSH, T4TOTAL, FREET4, T3FREE, THYROIDAB in the last 72 hours. Anemia Panel: No results for input(s): VITAMINB12, FOLATE, FERRITIN, TIBC, IRON, RETICCTPCT in the last 72 hours. Sepsis Labs: No results for input(s):  PROCALCITON, LATICACIDVEN in the last 168 hours.  No results found for this or any previous visit (from the past 240 hour(s)).       Radiology Studies: No results found.      Scheduled Meds: . amLODipine  5 mg Oral Daily  . atorvastatin  20 mg Oral Daily  . cholecalciferol  1,000 Units Oral Daily  . enoxaparin (LOVENOX) injection  0.5 mg/kg Subcutaneous Q24H  . fluticasone  2 spray Each Nare Daily  . hydrochlorothiazide  12.5 mg Oral Daily  . lisinopril  40 mg Oral Daily  . loratadine  10 mg Oral Daily  . pantoprazole  40 mg Oral Daily  . sodium bicarbonate  650 mg Oral BID   Continuous Infusions: . sodium chloride Stopped (06/26/20 1640)     LOS: 11 days    Time spent: 15 minutes    Sidney Ace, MD [facility] Pager [phone]  If 7PM-7AM, please contact night-coverage 06/29/2020, 11:02 AM

## 2020-06-30 NOTE — Progress Notes (Signed)
PROGRESS NOTE    Michelle Guerrero  IWP:[id number] DOB: April 28, 1941 DOA: 06/17/2020 PCP: Nanda Quinton, MD   Brief Narrative:  79 y.o. 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  Patient was discharged from rehab where she was placed after repair of a hip fracture 1 day prior to her hospitalization.  She denies having any fever or chills.  She denies having any sick contacts.  She has no abdominal pain and denies any NSAID use.  No prior history of C. difficile diarrhea or recent antibiotic use.  10/20: Diarrhea resolved.  Patient endorses sinus drainage and fullness today. 10/21: No bowel movements over interval.  No complaints this morning. 10/22: Questran discontinued.  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 difficile negative Diarrhea resolved 06/27/2020, stools becoming formed Plan: No indication for cholestyramine  Abnormal MRCP, concern for gallbladder malignancy General surgery saw patient here.  Refer to outpatient Daughter has appointment for November 8  Intractable nausea vomiting Resolved  Essential hypertension Continue lisinopril, amlodipine, hydrochlorothiazide  Acute kidney failure chronic kidney disease  stage IIIb Stable Creatinine depression baseline  Persistent leukocytosis Neutrophilic predominance.  White cell count alternates between 12 and 15 Possible underlying hematologic malignancy Continue to monitor Referral to hematology on discharge  Hyperlipidemia Continue Lipitor  GERD PPI  History of skin cancer on nose and left arm Local wound care Outpatient Derm follow-up  Weakness Functional decline PT evaluation, recommends rehab   DVT prophylaxis: Lovenox Code Status: Full  family Communication: Daughter [name] [phone] on 06/27/2020 Disposition Plan: Status is: Inpatient  Remains inpatient appropriate because:Unsafe d/c plan   Dispo: The patient is from: Home              Anticipated d/c is to: SNF              Anticipated d/c date is: 1 day              Patient currently is medically stable to d/c.   Patient currently medically stable for discharge.  Pending insurance authorization for skilled nursing facility bed in [place].  Once insurance authorization obtained patient can be discharged   Consultants:   none  Procedures:   none  Antimicrobials:  None   Subjective: Seen and examined.  No complaints.  Diarrhea resolved.  Objective: Vitals:   06/29/20 2319 06/30/20 0530 06/30/20 0827 06/30/20 1134  BP: (!) 117/52 132/61 116/72 (!) 133/57  Pulse: 87 79 74 82  Resp: 18 18 20 20   Temp: 97.6 F (36.4 C) 97.7 F (36.5 C) 97.6 F (36.4 C) (!) 97.5 F (36.4 C)  TempSrc: Oral Oral Oral Oral  SpO2: 93% 100% 97% 99%  Weight:      Height:  Intake/Output Summary (Last 24 hours) at 06/30/2020 1218 Last data filed at 06/30/2020 0900 Gross per 24 hour  Intake 120 ml  Output --  Net 120 ml   Filed Weights   06/17/20 2153  Weight: 82.3 kg    Examination:  General exam: Appears calm and comfortable  Respiratory system: Clear to auscultation. Respiratory effort normal. Cardiovascular system: S1 & S2 heard, RRR. No JVD, murmurs,  rubs, gallops or clicks. No pedal edema. Gastrointestinal system: Soft nontender, nondistended, normal bowel sounds  Central nervous system: Alert and oriented. No focal neurological deficits. Extremities: Symmetric 5 x 5 power. Skin: Facial lesions consistent with known history of skin cancer Psychiatry: Judgement and insight appear normal. Mood & affect appropriate.     Data Reviewed: I have personally reviewed following labs and imaging studies  CBC: Recent Labs  Lab 06/24/20 0334  WBC 13.4*  HGB 10.2*  HCT 31.3*  MCV 91.3  PLT 831   Basic Metabolic Panel: Recent Labs  Lab 06/24/20 0334 06/26/20 0551 06/27/20 0552  NA 137 136  --   K 3.9 4.2  --   CL 104 105  --   CO2 21* 21*  --   GLUCOSE 95 97  --   BUN 24* 27*  --   CREATININE 1.45* 1.62*  --   CALCIUM 8.6* 8.6*  --   MG  --  1.5* 2.1   GFR: Estimated Creatinine Clearance: 28.9 mL/min (A) (by C-G formula based on SCr of 1.62 mg/dL (H)). Liver Function Tests: No results for input(s): AST, ALT, ALKPHOS, BILITOT, PROT, ALBUMIN in the last 168 hours. No results for input(s): LIPASE, AMYLASE in the last 168 hours. No results for input(s): AMMONIA in the last 168 hours. Coagulation Profile: No results for input(s): INR, PROTIME in the last 168 hours. Cardiac Enzymes: No results for input(s): CKTOTAL, CKMB, CKMBINDEX, TROPONINI in the last 168 hours. BNP (last 3 results) No results for input(s): PROBNP in the last 8760 hours. HbA1C: No results for input(s): HGBA1C in the last 72 hours. CBG: No results for input(s): GLUCAP in the last 168 hours. Lipid Profile: No results for input(s): CHOL, HDL, LDLCALC, TRIG, CHOLHDL, LDLDIRECT in the last 72 hours. Thyroid Function Tests: No results for input(s): TSH, T4TOTAL, FREET4, T3FREE, THYROIDAB in the last 72 hours. Anemia Panel: No results for input(s): VITAMINB12, FOLATE, FERRITIN, TIBC, IRON, RETICCTPCT in the last 72 hours. Sepsis Labs: No results for input(s):  PROCALCITON, LATICACIDVEN in the last 168 hours.  No results found for this or any previous visit (from the past 240 hour(s)).       Radiology Studies: No results found.      Scheduled Meds:  amLODipine  5 mg Oral Daily   atorvastatin  20 mg Oral Daily   cholecalciferol  1,000 Units Oral Daily   enoxaparin (LOVENOX) injection  0.5 mg/kg Subcutaneous Q24H   fluticasone  2 spray Each Nare Daily   hydrochlorothiazide  12.5 mg Oral Daily   lisinopril  40 mg Oral Daily   loratadine  10 mg Oral Daily   pantoprazole  40 mg Oral Daily   sodium bicarbonate  650 mg Oral BID   Continuous Infusions:  sodium chloride Stopped (06/26/20 1640)     LOS: 12 days    Time spent: 15 minutes    Sidney Ace, MD [facility] Pager [phone]  If 7PM-7AM, please contact night-coverage 06/30/2020, 12:18 PM

## 2020-06-30 NOTE — Care Management Important Message (Signed)
Important Message  Patient Details  Name: [name] MRN: [id number] Date of Birth: [birth year]   Medicare Important Message Given:  Yes     Dannette Barbara 06/30/2020, 12:06 PM

## 2020-06-30 NOTE — TOC Progression Note (Signed)
Transition of Care [facility]) - Progression Note    Patient Details  Name: Michelle Guerrero MRN: [id number] Date of Birth: June 14, 1941  [facility]) CM/SW Contact  Beverly Sessions, RN Phone Number: 06/30/2020, 9:30 AM  Clinical Narrative:     Voicemail left Christy at [facility] to see if there is an update on auth.  Awaiting return call    Update:  auth still pending  Expected Discharge Plan: [place] Barriers to Discharge: Continued Medical Work up  Expected Discharge Plan and Services Expected Discharge Plan: [place] Choice: [place] arrangements for the past 2 months: Single Family Home                                       Social Determinants of Health (SDOH) Interventions    Readmission Risk Interventions No flowsheet data found.

## 2020-07-01 NOTE — Progress Notes (Signed)
BP  Was 98/55 earlier this am and currently 108/46. HR 76. Dr Priscella Mann said to hold am norvasc, hctz, and lisinopril and give at next VS check if BP is better

## 2020-07-01 NOTE — TOC Progression Note (Signed)
Transition of Care [facility]) - Progression Note    Patient Details  Name: Michelle Guerrero MRN: [id number] Date of Birth: 11/18/1940  [facility]) CM/SW Contact  Beverly Sessions, RN Phone Number: 07/01/2020, 3:29 PM  Clinical Narrative:    Per [place] Brick was denied. MD completed peer to peer.  Peer to peer was denied.   Per MD insurance states if we get another PT note and expedited appeal could be done if appropriate.   Spoke with daughter [name].  She wishes to proceed with expedited appeal.   PT note in.  Per facility they did not receive a [place]  I called and spoke with an [place] Medicare representative.  I inquired if I could initiate the appeal on the patients behalf, or it it had to be patient/family.  He states the MD had to initiate the expedited appeal, However he transferred me to the appeal line [phone].  It prompted me to leave a detailed message and that I would receive a return call within 24 hours. Message left    Expected Discharge Plan: Skilled Nursing Facility Barriers to Discharge: Continued Medical Work up  Expected Discharge Plan and Services Expected Discharge Plan: [place] Choice: [place] arrangements for the past 2 months: Single Family Home                                       Social Determinants of Health (SDOH) Interventions    Readmission Risk Interventions No flowsheet data found.

## 2020-07-01 NOTE — Progress Notes (Signed)
Physical Therapy Treatment Patient Details Name: Michelle Guerrero MRN: [id number] DOB: [birth year] Today's Date: 07/01/2020    History of Present Illness Michelle Guerrero is a 79 y.o. 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 MD assessment includes acute collitis and abnormal gallbladder ultrasound.    PT Comments    Pt resting in bed upon PT arrival.  Pt initially declining any OOB mobility d/t not feeling well but agreeable to ex's in bed.  Therapist discussed with pt her PLOF before this hospitalization and overall mobility since admitted to hospital.  After this discussion and some encouragement (d/t pt not feeling good), pt agreeable to OOB mobility.  Min to mod assist semi-supine to sitting edge of bed; mod assist plus CGA of 2nd to stand from bed up to RW; CGA to min assist x2 to take steps bed to recliner with RW; mod assist to stand from recliner up to RW; and min assist x1 to ambulate 5 feet with RW (chair follow).  Limited distance ambulating d/t generalized weakness and fatigue.  D/t current assist levels, weakness, and decreased activity tolerance, pt does not currently appear safe to discharge home--continue to recommend pt discharge to STR (pt does not appear safe to discharge home at this time d/t requiring 24/7 physical assist with daily functional mobility and ADL's--pt's daughter reports pt's husband unable to provide needed assist for safe home discharge; pt requiring increased physical therapy services at SNF level to improve strength and independence with functional mobility for an eventual safe home discharge).   Follow Up Recommendations  SNF     Equipment Recommendations  Rolling walker with 5" wheels;3in1 (PT)    Recommendations  for Other Services OT consult     Precautions / Restrictions Precautions Precautions: Fall Precaution Comments: L Hip s/p ORIF Restrictions Weight Bearing Restrictions: Yes LLE Weight Bearing: Weight bearing as tolerated    Mobility  Bed Mobility Overal bed mobility: Needs Assistance Bed Mobility: Supine to Sit     Supine to sit: Min assist;Mod assist;HOB elevated     General bed mobility comments: min assist for B LE's and mod assist for trunk semi-supine to sitting edge of bed; vc's for use of bed rail and overall technique  Transfers Overall transfer level: Needs assistance Equipment used: Rolling walker (2 wheeled) Transfers: Sit to/from [facility] Sit to Stand: Mod assist;+2 safety/equipment Stand pivot transfers: Min guard;Min assist;+2 safety/equipment (stand step turn bed to recliner with RW; assist for walker management; vc's for lining up to chair prior to sitting)       General transfer comment: assist to initiate and come to full stand from bed (mod assist x1 plus CGA of 2nd for safety) and mod assist x1 to stand from recliner; vc's for UE/LE placement; vc's for overall technique  Ambulation/Gait Ambulation/Gait assistance: Min assist;+2 safety/equipment (chair follow) Gait Distance (Feet): 5 Feet Assistive device: Rolling walker (2 wheeled)   Gait velocity: decreased   General Gait Details: mildly antalgic; decreased stance time L LE; decreased B LE step length/foot clearance/heelstrike (closer to a shuffling type gait); limited d/t fatigue   Stairs             Wheelchair Mobility    Modified Rankin (Stroke Patients Only)       Balance  Overall balance assessment: Needs assistance Sitting-balance support: No upper extremity supported;Feet supported Sitting balance-Leahy Scale: Good Sitting balance - Comments: steady sitting reaching within BOS   Standing balance support: Single extremity supported Standing balance-Leahy  Scale: Fair Standing balance comment: pt requiring at least single UE support for static standing balance and pt requiring B UE support on RW for ambulation for balance                            Cognition Arousal/Alertness: Awake/alert Behavior During Therapy: WFL for tasks assessed/performed Overall Cognitive Status: Within Functional Limits for tasks assessed                                        Exercises      General Comments  Pt agreeable to PT session.      Pertinent Vitals/Pain Pain Assessment: Faces Faces Pain Scale: Hurts a little bit Pain Location: L hip/thigh Pain Descriptors / Indicators: Sore Pain Intervention(s): Limited activity within patient's tolerance;Monitored during session;Repositioned  Vitals stable and WFL throughout treatment session.  BP supine 122/66; sitting edge of bed 125/66; and sitting in recliner 126/63.    Home Living                      Prior Function            PT Goals (current goals can now be found in the care plan section) Acute Rehab PT Goals Patient Stated Goal: to improve walking PT Goal Formulation: With patient Time For Goal Achievement: 07/02/20 Potential to Achieve Goals: Good Progress towards PT goals: Progressing toward goals    Frequency    Min 2X/week      PT Plan Current plan remains appropriate    Co-evaluation              AM-PAC PT "6 Clicks" Mobility   Outcome Measure  Help needed turning from your back to your side while in a flat bed without using bedrails?: A Little Help needed moving from lying on your back to sitting on the side of a flat bed without using bedrails?: A Lot Help needed moving to and from a bed to a chair (including a wheelchair)?: A Lot Help needed standing up from a chair using your arms (e.g., wheelchair or bedside chair)?: A Lot Help needed to walk in hospital room?: A Lot Help needed climbing 3-5 steps with a railing? : Total 6  Click Score: 12    End of Session Equipment Utilized During Treatment: Gait belt Activity Tolerance: Patient tolerated treatment well Patient left: in chair;with call bell/phone within reach;with chair alarm set Nurse Communication: Mobility status;Precautions;Other (comment) (pt's BP (and pt requesting BP medication)) PT Visit Diagnosis: Muscle weakness (generalized) (M62.81);Other abnormalities of gait and mobility (R26.89);History of falling (Z91.81)     Time: 9323-5573 PT Time Calculation (min) (ACUTE ONLY): 39 min  Charges:  $Therapeutic Activity: 38-52 mins                     Leitha Bleak, PT 07/01/20, 2:49 PM

## 2020-07-01 NOTE — Plan of Care (Signed)
Discussed continued plan of care  Problem: Education: Goal: Knowledge of General Education information will improve Description: Including pain rating scale, medication(s)/side effects and non-pharmacologic comfort measures Outcome: Progressing

## 2020-07-01 NOTE — Progress Notes (Signed)
PROGRESS NOTE    Michelle Guerrero  PYP:[id number] DOB: [birth year] DOA: 06/17/2020 PCP: Nanda Quinton, MD   Brief Narrative:  79 y.o. 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  Patient was discharged from rehab where she was placed after repair of a hip fracture 1 day prior to her hospitalization.  She denies having any fever or chills.  She denies having any sick contacts.  She has no abdominal pain and denies any NSAID use.  No prior history of C. difficile diarrhea or recent antibiotic use.  10/20: Diarrhea resolved.  Patient endorses sinus drainage and fullness today. 10/21: No bowel movements over interval.  No complaints this morning. 10/22: Questran discontinued.  Patient having formed stools  10/26: Patient medically stable for discharge.  Received notification that insurance had filed an intent to deny coverage and skilled nursing facility.  Completed peer to peer with [facility] physician advisor.  Requested repeat physical therapy evaluation to determine true need of skilled nursing facility post discharge.   Assessment & Plan:   Principal Problem:   Acute colitis Active Problems:   Type 2 diabetes mellitus with microalbuminuria (HCC)   CKD (chronic kidney disease), stage IV (HCC)   Essential hypertension   GERD (gastroesophageal reflux disease)   Acute renal failure superimposed on chronic kidney disease (HCC)   Acute pain of left knee   Weakness   Nausea and vomiting   Leukocytosis  Acute colitis with diarrhea Completed 5 days of Cipro/Flagyl Some persistent diarrhea Responded to cholestyramine 3 times daily Stool GI PCR and C. difficile negative Diarrhea resolved 06/27/2020, stools becoming formed Plan: No indication for  cholestyramine Diet as tolerated Monitor for BM  Abnormal MRCP, concern for gallbladder malignancy General surgery saw patient here.  Refer to outpatient Daughter has appointment for November 8  Intractable nausea vomiting Resolved  Essential hypertension Continue lisinopril, amlodipine, hydrochlorothiazide  Acute kidney failure chronic kidney disease stage IIIb Stable Creatinine depression baseline  Persistent leukocytosis Neutrophilic predominance.  White cell count alternates between 12 and 15 Possible underlying hematologic malignancy Continue to monitor Referral to hematology on discharge  Hyperlipidemia Continue Lipitor  GERD PPI  History of skin cancer on nose and left arm Local wound care Outpatient Derm follow-up  Weakness Functional decline PT evaluation, recommends rehab   DVT prophylaxis: Lovenox Code Status: Full  family Communication: Daughter [name] [phone] on 07/01/2020 Disposition Plan: Status is: Inpatient  Remains inpatient appropriate because:Unsafe d/c plan   Dispo: The patient is from: Home              Anticipated d/c is to: SNF              Anticipated d/c date is: 1 day              Patient currently is medically stable to d/c.   Patient is currently medically stable for discharge.  Patient's insurance was questioning the need for placement in the skilled nursing facility versus home with home health.  I have communicated with TOC and physical therapy.  Request repeat physical therapy evaluation today.  Document specifically need for daily care in a skilled nursing facility.  Once repeat physical therapy evaluations are completed we will resubmit via emergency appeal to the insurance carrier and plan disposition based on their  response.   Consultants:   none  Procedures:   none  Antimicrobials:  None   Subjective: Seen and examined.  No complaints.  Diarrhea resolved.  Objective: Vitals:   07/01/20 0755 07/01/20 1009  07/01/20 1133 07/01/20 1315  BP: (!) 98/55 (!) 108/46 101/60 122/66  Pulse: 75 76 77   Resp: 17  16   Temp: 97.7 F (36.5 C)  (!) 97.5 F (36.4 C)   TempSrc:      SpO2: 95%  97%   Weight:      Height:        Intake/Output Summary (Last 24 hours) at 07/01/2020 1442 Last data filed at 07/01/2020 1300 Gross per 24 hour  Intake 240 ml  Output 400 ml  Net -160 ml   Filed Weights   06/17/20 2153  Weight: 82.3 kg    Examination:  General exam: Appears calm and comfortable  Respiratory system: Clear to auscultation. Respiratory effort normal. Cardiovascular system: S1 & S2 heard, RRR. No JVD, murmurs, rubs, gallops or clicks. No pedal edema. Gastrointestinal system: Soft nontender, nondistended, normal bowel sounds  Central nervous system: Alert and oriented. No focal neurological deficits. Extremities: Symmetric 5 x 5 power. Skin: Facial lesions consistent with known history of skin cancer Psychiatry: Judgement and insight appear normal. Mood & affect appropriate.     Data Reviewed: I have personally reviewed following labs and imaging studies  CBC: No results for input(s): WBC, NEUTROABS, HGB, HCT, MCV, PLT in the last 168 hours. Basic Metabolic Panel: Recent Labs  Lab 06/26/20 0551 06/27/20 0552  NA 136  --   K 4.2  --   CL 105  --   CO2 21*  --   GLUCOSE 97  --   BUN 27*  --   CREATININE 1.62*  --   CALCIUM 8.6*  --   MG 1.5* 2.1   GFR: Estimated Creatinine Clearance: 28.9 mL/min (A) (by C-G formula based on SCr of 1.62 mg/dL (H)). Liver Function Tests: No results for input(s): AST, ALT, ALKPHOS, BILITOT, PROT, ALBUMIN in the last 168 hours. No results for input(s): LIPASE, AMYLASE in the last 168 hours. No results for input(s): AMMONIA in the last 168 hours. Coagulation Profile: No results for input(s): INR, PROTIME in the last 168 hours. Cardiac Enzymes: No results for input(s): CKTOTAL, CKMB, CKMBINDEX, TROPONINI in the last 168 hours. BNP (last 3  results) No results for input(s): PROBNP in the last 8760 hours. HbA1C: No results for input(s): HGBA1C in the last 72 hours. CBG: No results for input(s): GLUCAP in the last 168 hours. Lipid Profile: No results for input(s): CHOL, HDL, LDLCALC, TRIG, CHOLHDL, LDLDIRECT in the last 72 hours. Thyroid Function Tests: No results for input(s): TSH, T4TOTAL, FREET4, T3FREE, THYROIDAB in the last 72 hours. Anemia Panel: No results for input(s): VITAMINB12, FOLATE, FERRITIN, TIBC, IRON, RETICCTPCT in the last 72 hours. Sepsis Labs: No results for input(s): PROCALCITON, LATICACIDVEN in the last 168 hours.  No results found for this or any previous visit (from the past 240 hour(s)).       Radiology Studies: No results found.      Scheduled Meds: . amLODipine  5 mg Oral Daily  . atorvastatin  20 mg Oral Daily  . cholecalciferol  1,000 Units Oral Daily  . enoxaparin (LOVENOX) injection  0.5 mg/kg Subcutaneous Q24H  . fluticasone  2 spray Each Nare Daily  . hydrochlorothiazide  12.5 mg Oral Daily  . lisinopril  40 mg Oral Daily  .  loratadine  10 mg Oral Daily  . pantoprazole  40 mg Oral Daily  . sodium bicarbonate  650 mg Oral BID   Continuous Infusions: . sodium chloride Stopped (06/26/20 1640)     LOS: 13 days    Time spent: 15 minutes    Sidney Ace, MD [facility] Pager [phone]  If 7PM-7AM, please contact night-coverage 07/01/2020, 2:42 PM

## 2020-07-02 ENCOUNTER — Encounter: Payer: Self-pay | Admitting: Family Medicine

## 2020-07-02 NOTE — Progress Notes (Signed)
Physical Therapy Treatment Patient Details Name: Michelle Guerrero MRN: [id number] DOB: Sep 18, 1940 Today's Date: 07/02/2020    History of Present Illness Michelle Guerrero is a 79 y.o. 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 MD assessment includes acute collitis and abnormal gallbladder ultrasound.    PT Comments    Pt resting in bed upon PT arrival; agreeable to OOB mobility.  Brief donned beginning of session and doffed end of session per pt request.  Supervision with logrolling in bed and min to mod assist semi-supine to sitting edge of bed.  Min assist with transfers using RW (with vc's for UE placement).  Min assist ambulating 10 feet with RW (close chair follow)--limited distance ambulating d/t generalized weakness and fatigue.  Antalgic gait noted with decreased stance time L LE and more of a shuffling gait pattern.  Pt reporting mild "throbbing" of L hip/thigh (2/10) and headache (2/10) end of session (pt reports recent pain medication).  Will continue to focus on strengthening and progressive functional mobility per pt tolerance.  D/t current assist levels and limited functional mobility, continue to recommend STR.    Follow Up Recommendations  SNF     Equipment Recommendations  Rolling walker with 5" wheels;3in1 (PT)    Recommendations for Other Services OT consult     Precautions / Restrictions Precautions Precautions: Fall Precaution Comments: L Hip s/p ORIF Restrictions Weight Bearing Restrictions: Yes LLE Weight Bearing: Weight bearing as tolerated    Mobility  Bed Mobility Overal bed mobility: Needs Assistance Bed Mobility: Supine to Sit Rolling: Supervision (logrolling L and R in bed to donn brief; use of bed rails)    Supine to sit: Min assist;Mod assist;HOB elevated     General bed mobility comments: assist for trunk semi-supine to sitting edge of bed; vc's for technique  Transfers Overall transfer level: Needs assistance Equipment used: Rolling walker (2 wheeled) Transfers: Sit to/from [facility] Sit to Stand: Min assist (x1 trial standing from bed and x2 trials standing from recliner (vc's for UE placement)) Stand pivot transfers: Min assist (stand step turn bed to recliner with RW)       General transfer comment: pt using momentum to stand from bed and recliner  Ambulation/Gait Ambulation/Gait assistance: Min assist;+2 safety/equipment (chair follow) Gait Distance (Feet): 10 Feet Assistive device: Rolling walker (2 wheeled)   Gait velocity: decreased   General Gait Details: antalgic; decreased stance time L LE; decreased B LE step length/foot clearance/heelstrike (more of a shuffling type gait pattern); limited distance d/t generalized weakness and fatigue   Stairs             Wheelchair Mobility    Modified Rankin (Stroke Patients Only)       Balance Overall balance assessment: Needs assistance Sitting-balance support: No upper extremity supported;Feet supported Sitting balance-Leahy Scale: Good Sitting balance - Comments: steady sitting reaching within BOS   Standing balance support: Single extremity supported Standing balance-Leahy Scale: Fair Standing balance comment: pt requiring at least single UE support for static standing balance; heavy B UE support through RW for ambulation                            Cognition Arousal/Alertness: Awake/alert Behavior During Therapy: [facility] for  tasks assessed/performed Overall Cognitive Status: Within Functional Limits for tasks assessed                                        Exercises      General Comments  Pt agreeable to PT session.      Pertinent Vitals/Pain Pain  Assessment: 0-10 Pain Score: 2  Pain Location: L hip/thigh and headache Pain Descriptors / Indicators: Throbbing;Headache Pain Intervention(s): Limited activity within patient's tolerance;Monitored during session;Premedicated before session;Repositioned  Vitals (HR and O2 on room air) stable and WFL throughout treatment session.    Home Living                      Prior Function            PT Goals (current goals can now be found in the care plan section) Acute Rehab PT Goals Patient Stated Goal: to improve walking PT Goal Formulation: With patient Time For Goal Achievement: 07/02/20 Potential to Achieve Goals: Good Progress towards PT goals: Progressing toward goals    Frequency    Min 2X/week      PT Plan Current plan remains appropriate    Co-evaluation              AM-PAC PT "6 Clicks" Mobility   Outcome Measure  Help needed turning from your back to your side while in a flat bed without using bedrails?: A Little Help needed moving from lying on your back to sitting on the side of a flat bed without using bedrails?: A Lot Help needed moving to and from a bed to a chair (including a wheelchair)?: A Little Help needed standing up from a chair using your arms (e.g., wheelchair or bedside chair)?: A Little Help needed to walk in hospital room?: A Lot Help needed climbing 3-5 steps with a railing? : Total 6 Click Score: 14    End of Session Equipment Utilized During Treatment: Gait belt Activity Tolerance: Patient tolerated treatment well Patient left: in chair;with call bell/phone within reach;with chair alarm set;Other (comment) (NT replaced purewick; B heels floating via pillow support) Nurse Communication: Mobility status;Precautions (NT notified) PT Visit Diagnosis: Muscle weakness (generalized) (M62.81);Other abnormalities of gait and mobility (R26.89);History of falling (Z91.81)     Time: 1445-1511 PT Time Calculation (min) (ACUTE ONLY): 26  min  Charges:  $Gait Training: 8-22 mins $Therapeutic Activity: 8-22 mins                    Leitha Bleak, PT 07/02/20, 3:27 PM

## 2020-07-02 NOTE — Progress Notes (Addendum)
PROGRESS NOTE    DAY GREB  TFT:[id number] DOB: August 15, 1941 DOA: 06/17/2020 PCP: Nanda Quinton, MD   Brief Narrative:  79 y.o. 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  Patient was discharged from rehab where she was placed after repair of a hip fracture 1 day prior to her hospitalization.  She denies having any fever or chills.  She denies having any sick contacts.  She has no abdominal pain and denies any NSAID use.  No prior history of C. difficile diarrhea or recent antibiotic use.  10/20: Diarrhea resolved.  Patient endorses sinus drainage and fullness today. 10/21: No bowel movements over interval.  No complaints this morning. 10/22: Questran discontinued.  Patient having formed stools  10/26: Patient medically stable for discharge.  Received notification that insurance had filed an intent to deny coverage and skilled nursing facility.  Completed peer to peer with [facility] physician advisor.  Requested repeat physical therapy evaluation to determine true need of skilled nursing facility post discharge.  10/27: Remains medically stable for discharge.  Repeat PT evaluation recommends SNF.  Patient's daughter would like to keep her here until her appeal to insurance gets answered.  Per TOC this can take up to 72 hours  Assessment & Plan:   Principal Problem:   Acute colitis Active Problems:   Type 2 diabetes mellitus with microalbuminuria (HCC)   CKD (chronic kidney disease), stage IV (HCC)   Essential hypertension   GERD (gastroesophageal reflux disease)   Acute renal failure superimposed on chronic kidney disease (HCC)   Acute pain of left knee   Weakness   Nausea and vomiting   Leukocytosis  Acute colitis with diarrhea Completed 5 days of  Cipro/Flagyl Some persistent diarrhea Responded to cholestyramine 3 times daily Stool GI PCR and C. difficile negative Diarrhea resolved 06/27/2020, stools becoming formed Plan: No indication for cholestyramine Diet as tolerated She has had 2 bowel movement today with no blood reported  Abnormal MRCP, concern for gallbladder malignancy General surgery saw patient here.  Refer to outpatient Daughter has appointment for November 8  Intractable nausea vomiting Resolved  Essential hypertension Blood pressure running low.  Will hold lisinopril, amlodipine, hydrochlorothiazide  Acute kidney failure chronic kidney disease stage IIIb Stable Creatinine depression baseline  Persistent leukocytosis Neutrophilic predominance.  White cell count alternates between 12 and 15 Possible underlying hematologic malignancy Continue to monitor Referral to hematology on discharge  Hyperlipidemia Continue Lipitor  GERD PPI  History of skin cancer on nose and left arm Local wound care Outpatient Derm follow-up  Weakness Functional decline PT evaluation, recommends rehab   DVT prophylaxis: Lovenox Code Status: Full  family Communication: Daughter [name] [phone] on 07/02/2020 Disposition Plan: Status is: Inpatient  Remains inpatient appropriate because:Unsafe d/c plan   Dispo: The patient is from: Home              Anticipated d/c is to: SNF              Anticipated d/c date is: 1 day              Patient currently is medically stable to d/c.   Patient is currently medically stable for discharge.  Patient's insurance was questioning the need for placement in the skilled nursing facility versus home with home health.  I have communicated with TOC and physical therapy.  Request repeat physical therapy still recommends SNF.  resubmited via emergency appeal to the insurance carrier and plan disposition based on their response which can take up to 72 hours per TOC.  Daughter is aware.   She would like to keep her here till the decision is made by insurance.   Consultants:   none  Procedures:   none  Antimicrobials:  None   Subjective: Seen and examined.  No complaints.  Objective: Vitals:   07/02/20 0442 07/02/20 0802 07/02/20 1147 07/02/20 1522  BP: 112/63 (!) 113/49 (!) 109/49 (!) 90/55  Pulse: 63 64 61 99  Resp: 16 18 16 16   Temp: 98.9 F (37.2 C) 97.7 F (36.5 C) 97.6 F (36.4 C) 98 F (36.7 C)  TempSrc:  Oral    SpO2: 100% 100% 96% 97%  Weight:      Height:        Intake/Output Summary (Last 24 hours) at 07/02/2020 1718 Last data filed at 07/02/2020 1046 Gross per 24 hour  Intake 120 ml  Output 300 ml  Net -180 ml   Filed Weights   06/17/20 2153  Weight: 82.3 kg    Examination:  General exam: Appears calm and comfortable  Respiratory system: Clear to auscultation. Respiratory effort normal. Cardiovascular system: S1 & S2 heard, RRR. No JVD, murmurs, rubs, gallops or clicks. No pedal edema. Gastrointestinal system: Soft nontender, nondistended, normal bowel sounds  Central nervous system: Alert and oriented. No focal neurological deficits. Extremities: Symmetric 5 x 5 power. Skin: Facial lesions consistent with known history of skin cancer Psychiatry: Judgement and insight appear normal. Mood & affect appropriate.     Data Reviewed: I have personally reviewed following labs and imaging studies  CBC: No results for input(s): WBC, NEUTROABS, HGB, HCT, MCV, PLT in the last 168 hours. Basic Metabolic Panel: Recent Labs  Lab 06/26/20 0551 06/27/20 0552  NA 136  --   K 4.2  --   CL 105  --   CO2 21*  --   GLUCOSE 97  --   BUN 27*  --   CREATININE 1.62*  --   CALCIUM 8.6*  --   MG 1.5* 2.1   GFR: Estimated Creatinine Clearance: 28.9 mL/min (A) (by C-G formula based on SCr of 1.62 mg/dL (H)). Liver Function Tests: No results for input(s): AST, ALT, ALKPHOS, BILITOT, PROT, ALBUMIN in the last 168 hours. No results for  input(s): LIPASE, AMYLASE in the last 168 hours. No results for input(s): AMMONIA in the last 168 hours. Coagulation Profile: No results for input(s): INR, PROTIME in the last 168 hours. Cardiac Enzymes: No results for input(s): CKTOTAL, CKMB, CKMBINDEX, TROPONINI in the last 168 hours. BNP (last 3 results) No results for input(s): PROBNP in the last 8760 hours. HbA1C: No results for input(s): HGBA1C in the last 72 hours. CBG: No results for input(s): GLUCAP in the last 168 hours. Lipid Profile: No results for input(s): CHOL, HDL, LDLCALC, TRIG, CHOLHDL, LDLDIRECT in the last 72 hours. Thyroid Function Tests: No results for input(s): TSH, T4TOTAL, FREET4, T3FREE, THYROIDAB in the last 72 hours. Anemia Panel: No results for input(s): VITAMINB12, FOLATE, FERRITIN, TIBC, IRON, RETICCTPCT in the last 72 hours. Sepsis Labs: No results for input(s): PROCALCITON, LATICACIDVEN in the last 168 hours.  No results found for this or any previous visit (from the past 240 hour(s)).       Radiology Studies: No results found.  Scheduled Meds: . amLODipine  5 mg Oral Daily  . atorvastatin  20 mg Oral Daily  . cholecalciferol  1,000 Units Oral Daily  . enoxaparin (LOVENOX) injection  0.5 mg/kg Subcutaneous Q24H  . fluticasone  2 spray Each Nare Daily  . hydrochlorothiazide  12.5 mg Oral Daily  . lisinopril  40 mg Oral Daily  . loratadine  10 mg Oral Daily  . pantoprazole  40 mg Oral Daily  . sodium bicarbonate  650 mg Oral BID   Continuous Infusions: . sodium chloride Stopped (06/26/20 1640)     LOS: 14 days    Time spent: 15 minutes    Max Sane, MD [facility] Pager [phone]  If 7PM-7AM, please contact night-coverage 07/02/2020, 5:18 PM

## 2020-07-02 NOTE — TOC Progression Note (Signed)
Transition of Care [facility]) - Progression Note    Patient Details  Name: Michelle Guerrero MRN: [id number] Date of Birth: 05-17-1941  [facility] CM/SW Contact  Beverly Sessions, RN Phone Number: 07/02/2020, 2:54 PM  Clinical Narrative:      Received call from [facility] [phone]).  Additional faxed per her request to [phone]   Expected Discharge Plan: [place] Barriers to Discharge: Continued Medical Work up  Expected Discharge Plan and Services Expected Discharge Plan: [place] Choice: [place] arrangements for the past 2 months: Single Family Home                                       Social Determinants of Health (SDOH) Interventions    Readmission Risk Interventions No flowsheet data found.

## 2020-07-02 NOTE — Plan of Care (Signed)
Progressing in care.  She had two BMs today that had no blood.  She sat up in the chair for an hour today.  No significant changes, continue to monitor.

## 2020-07-03 MED ORDER — ENSURE ENLIVE PO LIQD
237.0000 mL | Freq: Two times a day (BID) | ORAL | Status: DC
  Administered 2020-07-03 – 2020-07-04 (×2): 237 mL via ORAL

## 2020-07-03 MED ORDER — ADULT MULTIVITAMIN W/MINERALS CH
1.0000 | ORAL_TABLET | Freq: Every day | ORAL | Status: DC
  Administered 2020-07-04: 1 via ORAL
  Filled 2020-07-03: qty 1

## 2020-07-03 NOTE — Evaluation (Signed)
Physical Therapy Re-Evaluation Patient Details Name: Michelle Guerrero MRN: [id number] DOB: 07-23-1941 Today's Date: 07/03/2020   History of Present Illness  Michelle Guerrero is a 79 y.o. 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 MD assessment includes acute collitis and abnormal gallbladder ultrasound.  Clinical Impression  PT re-evaluation performed d/t pt's extended hospitalization.  Min to mod assist with bed mobility; CGA to min assist with transfers with RW; and CGA to min assist to ambulate 15 feet x2 with RW.  Limited distance ambulating d/t generalized weakness, fatigue, and L hip/thigh pain.  Pt noted with increased antalgic gait/decreased stance time L LE 2nd trial of ambulation (pt reporting L hip/thigh pain with activity but improved with rest).  Will continue to focus on strengthening and progressive functional mobility per pt tolerance.  POC reviewed and updated as appropriate.    Follow Up Recommendations SNF    Equipment Recommendations  Rolling walker with 5" wheels;3in1 (PT)    Recommendations for Other Services OT consult     Precautions / Restrictions Precautions Precautions: Fall Precaution Comments: L Hip s/p ORIF Restrictions Weight Bearing Restrictions: Yes LLE Weight Bearing: Weight bearing as tolerated      Mobility  Bed Mobility Overal bed mobility: Needs Assistance Bed Mobility: Supine to Sit;Sit to Supine     Supine to sit: Min assist;Mod assist (assist for trunk; vc's for technique) Sit to supine: Min assist;HOB elevated (assist for L LE)   General bed mobility comments: 2 assist to boost pt up in bed using bed sheets end of session    Transfers Overall transfer level: Needs assistance Equipment used:  Rolling walker (2 wheeled) Transfers: Sit to/from Stand Sit to Stand: Min guard;Min assist         General transfer comment: CGA to stand from bed 1st trial and min assist to stand from bed 2nd trial; pt using momentum to stand from bed; vc's for UE placement (pt preferring to stand with B hands on walker)  Ambulation/Gait Ambulation/Gait assistance: Min guard;Min assist Gait Distance (Feet):  (15 feet x2) Assistive device: Rolling walker (2 wheeled)   Gait velocity: decreased   General Gait Details: antalgic; decreased stance time L LE; decreased B LE step length/foot clearance/heelstrike (decreased shuffling type gait pattern compared to previous sessions); limited distance d/t generalized weakness, fatigue, and L hip/thigh pain  Stairs            Wheelchair Mobility    Modified Rankin (Stroke Patients Only)       Balance Overall balance assessment: Needs assistance Sitting-balance support: No upper extremity supported;Feet supported Sitting balance-Leahy Scale: Good Sitting balance - Comments: steady sitting reaching within BOS   Standing balance support: Single extremity supported Standing balance-Leahy Scale: Fair Standing balance comment: pt requiring at least single UE support for static standing balance; heavy B UE support through RW for ambulation                             Pertinent Vitals/Pain Pain Assessment: 0-10 Faces Pain Scale: Hurts a little bit Pain Location: L hip/thigh and headache Pain Descriptors / Indicators: Headache;Aching;Sore Pain Intervention(s): Limited activity within patient's tolerance;Monitored during session;Repositioned;Patient requesting pain meds-RN notified  Vitals (HR and O2 on room air)  stable and WFL throughout treatment session.    Home Living Family/patient expects to be discharged to:: Skilled nursing facility Living Arrangements: Spouse/significant other Available Help at Discharge: Family;Available  PRN/intermittently Type of Home: House Home Access: Ramped entrance     Home Layout: One level Home Equipment: Walker - 2 wheels;Walker - 4 wheels;Wheelchair - manual Additional Comments: h/o 2 recent falls (no significant injury 1st fall; L hip fx 2nd fall)    Prior Function Level of Independence: Needs assistance         Comments: Pt able to ambulate household distance with walker with SBA upon discharge home from rehab (but required assist with stairs); Independent with ADL's prior to recent falls.     Hand Dominance        Extremity/Trunk Assessment   Upper Extremity Assessment Upper Extremity Assessment: Generalized weakness    Lower Extremity Assessment Lower Extremity Assessment: Generalized weakness (L hip flexion 3+/5; knee flexion/extension at least 3/5 AROM)    Cervical / Trunk Assessment Cervical / Trunk Assessment: Normal  Communication   Communication: HOH  Cognition Arousal/Alertness: Awake/alert Behavior During Therapy: WFL for tasks assessed/performed Overall Cognitive Status: Within Functional Limits for tasks assessed                                        General Comments   Nursing cleared pt for participation in physical therapy.  Pt agreeable to PT session.    Exercises  Gait training   Assessment/Plan    PT Assessment Patient needs continued PT services  PT Problem List Decreased strength;Decreased activity tolerance;Decreased balance;Decreased mobility;Decreased knowledge of use of DME;Pain       PT Treatment Interventions DME instruction;Therapeutic exercise;Gait training;Balance training;Functional mobility training;Therapeutic activities;Patient/family education    PT Goals (Current goals can be found in the Care Plan section)  Acute Rehab PT Goals Patient Stated Goal: to improve walking PT Goal Formulation: With patient Time For Goal Achievement: 07/17/20 Potential to Achieve Goals: Good    Frequency Min  2X/week   Barriers to discharge Decreased caregiver support      Co-evaluation               AM-PAC PT "6 Clicks" Mobility  Outcome Measure Help needed turning from your back to your side while in a flat bed without using bedrails?: A Little Help needed moving from lying on your back to sitting on the side of a flat bed without using bedrails?: A Lot Help needed moving to and from a bed to a chair (including a wheelchair)?: A Little Help needed standing up from a chair using your arms (e.g., wheelchair or bedside chair)?: A Little Help needed to walk in hospital room?: A Little Help needed climbing 3-5 steps with a railing? : Total 6 Click Score: 15    End of Session Equipment Utilized During Treatment: Gait belt Activity Tolerance: Patient tolerated treatment well Patient left: in bed;with call bell/phone within reach;with bed alarm set;Other (comment) (B heels floating via pillow support) Nurse Communication: Mobility status;Precautions;Other (comment) (NT replaced purewick end of session) PT Visit Diagnosis: Muscle weakness (generalized) (M62.81);Other abnormalities of gait and mobility (R26.89);History of falling (Z91.81);Pain Pain - Right/Left: Left Pain - part of body: Hip    Time: 1696-7893 PT Time Calculation (min) (ACUTE ONLY): 20 min   Charges:   PT Evaluation $PT Re-evaluation: 1 Re-eval PT Treatments $Gait Training: 8-22 mins  Leitha Bleak, PT 07/03/20, 4:05 PM

## 2020-07-03 NOTE — Progress Notes (Addendum)
Initial Nutrition Assessment  DOCUMENTATION CODES:   Morbid obesity  INTERVENTION:   Ensure Enlive po BID, each supplement provides 350 kcal and 20 grams of protein  MVI daily   NUTRITION DIAGNOSIS:   Inadequate oral intake related to acute illness as evidenced by meal completion < 50%.  GOAL:   Patient will meet greater than or equal to 90% of their needs  MONITOR:   PO intake, Supplement acceptance, Labs, Weight trends, Skin, I & O's  REASON FOR ASSESSMENT:   LOS    ASSESSMENT:   79 y.o. 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   Met with pt in room today. Pt reports poor appetite and oral intake at baseline; pt reports that she does not eat a lot at home. Pt reports that her appetite has remained poor in hospital; pt eating <50% of meals. Pt reports that she does not drink supplements at home but that she is willing to try them in hospital. RD discussed with pt how adequate nutrition is needed to preserve lean muscle. Pt would like to try strawberry Ensure. RD will add supplements and MVI to help pt meet her estimated needs. Would recommend also considering an appetite stimulant. Per chart, pt is down 11lbs(6%) since admit; this is significant. Plan is for SNF at discharge.   Medications reviewed and include: D3, lovenox, protonix, Na bicarbonate  Labs reviewed: K 4.2 wnl, BUN 27(H), creat 1.62(H), Mg 2.1 wnl Wbc- 13.4(H), Hgb 10.2(L), Hct 31.3(L)  NUTRITION - FOCUSED PHYSICAL EXAM:    Most Recent Value  Orbital Region No depletion  Upper Arm Region No depletion  Thoracic and Lumbar Region No depletion  Buccal Region No depletion  Temple Region Mild depletion  Clavicle Bone Region Moderate depletion  Scapular Bone Region No depletion  Dorsal Hand Mild depletion  Patellar Region No depletion  Anterior Thigh Region No depletion   Posterior Calf Region No depletion  Edema (RD Assessment) Mild  Hair Reviewed  Eyes Reviewed  Mouth Reviewed  Skin Reviewed  Nails Reviewed     Diet Order:   Diet Order            Diet regular Room service appropriate? Yes; Fluid consistency: Thin  Diet effective now                EDUCATION NEEDS:   Education needs have been addressed  Skin:  Skin Assessment: Reviewed RN Assessment (non pressure injury arm)  Last BM:  10/27- TYPE 6  Height:   Ht Readings from Last 1 Encounters:  06/17/20 _0  (1.575 m)    Weight:   Wt Readings from Last 1 Encounters:  07/03/20 77.1 kg    Ideal Body Weight:  50 kg  BMI:  Body mass index is 31.08 kg/m.  Estimated Nutritional Needs:   Kcal:  1700-1900kcal/day  Protein:  85-95g/day  Fluid:  1.5L/day  Koleen Distance MS, RD, LDN Please refer to [facility] for RD and/or RD on-call/weekend/after hours pager

## 2020-07-03 NOTE — Care Management Important Message (Signed)
Important Message  Patient Details  Name: Michelle Guerrero MRN: [id number] Date of Birth: 1940/12/03   Medicare Important Message Given:  Yes     Dannette Barbara 07/03/2020, 1:33 PM

## 2020-07-03 NOTE — Progress Notes (Signed)
PROGRESS NOTE    Michelle Guerrero  YWV:[id number] DOB: 1941-07-09 DOA: 06/17/2020 PCP: Nanda Quinton, MD   Brief Narrative:  79 y.o. 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  Patient was discharged from rehab where she was placed after repair of a hip fracture 1 day prior to her hospitalization.  She denies having any fever or chills.  She denies having any sick contacts.  She has no abdominal pain and denies any NSAID use.  No prior history of C. difficile diarrhea or recent antibiotic use.  10/20: Diarrhea resolved.  Patient endorses sinus drainage and fullness today. 10/21: No bowel movements over interval.  No complaints this morning. 10/22: Questran discontinued.  Patient having formed stools  10/26: Patient medically stable for discharge.  Received notification that insurance had filed an intent to deny coverage and skilled nursing facility.  Completed peer to peer with [facility] physician advisor.  Requested repeat physical therapy evaluation to determine true need of skilled nursing facility post discharge.  10/27: Remains medically stable for discharge.  Repeat PT evaluation recommends SNF.  Patient's daughter would like to keep her here until her appeal to insurance gets answered.  Per TOC this can take up to 72 hours  10/28: Complains of some sinus pain and headache.  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 difficile negative Diarrhea resolved 06/27/2020, stools becoming formed  Abnormal MRCP, concern for gallbladder malignancy General surgery saw patient here.  Refer to outpatient Daughter has appointment for November 8  Intractable nausea vomiting Resolved  Essential hypertension Blood pressure running low.  Will hold lisinopril, amlodipine, hydrochlorothiazide  Acute kidney failure chronic kidney disease stage IIIb Stable Creatinine depression baseline  Persistent leukocytosis Neutrophilic predominance.  White cell count alternates between 12 and 15 Possible underlying hematologic malignancy Continue to monitor Referral to hematology on discharge  Hyperlipidemia Continue Lipitor  GERD PPI  History of skin cancer on nose and left arm Local wound care Outpatient Derm follow-up  Weakness Functional decline PT evaluation, recommends rehab   DVT prophylaxis: Lovenox Code Status: Full  family Communication: Daughter [name] [phone] on 07/02/2020 Disposition Plan: Status is: Inpatient  Remains inpatient appropriate because:Unsafe d/c plan   Dispo: The patient is from: Home              Anticipated d/c is to: SNF              Anticipated d/c date is: 1 day              Patient currently is medically stable to d/c.   Patient is currently medically stable for discharge.  Patient's insurance was questioning the need for placement in the skilled nursing facility versus home with home health.  resubmited via emergency appeal to the insurance carrier and plan disposition based on their response which can take up to 72 hours per TOC.  Daughter is aware.  She would like to keep her here till the decision is made by insurance.   Consultants:   none  Procedures:     none  Antimicrobials:  None   Subjective: Sinus pain, headache.  Blood pressure running low Objective: Vitals:   07/02/20 1522 07/02/20 2122 07/03/20 0506 07/03/20 1137  BP: (!) 90/55 (!) 102/57 (!) 106/52 (!) 99/53  Pulse: 99 81 79 67  Resp: 16 18 16 16   Temp: 98 F (36.7 C) 98 F (36.7 C) 98.7 F (37.1 C) 97.7 F (36.5 C)  TempSrc:  Oral Oral Oral  SpO2: 97% 95% 99% 95%  Weight:      Height:        Intake/Output Summary (Last 24 hours) at 07/03/2020 1315 Last data filed at 07/03/2020 0934 Gross per 24 hour  Intake 0 ml  Output 100 ml  Net -100 ml   Filed Weights   06/17/20 2153  Weight: 82.3 kg    Examination:  General exam: Appears calm and comfortable  Respiratory system: Clear to auscultation. Respiratory effort normal. Cardiovascular system: S1 & S2 heard, RRR. No JVD, murmurs, rubs, gallops or clicks. No pedal edema. Gastrointestinal system: Soft nontender, nondistended, normal bowel sounds  Central nervous system: Alert and oriented. No focal neurological deficits. Extremities: Symmetric 5 x 5 power. Skin: Facial lesions consistent with known history of skin cancer Psychiatry: Judgement and insight appear normal. Mood & affect appropriate.     Data Reviewed: I have personally reviewed following labs and imaging studies  CBC: No results for input(s): WBC, NEUTROABS, HGB, HCT, MCV, PLT in the last 168 hours. Basic Metabolic Panel: Recent Labs  Lab 06/27/20 0552  MG 2.1   GFR: Estimated Creatinine Clearance: 28.9 mL/min (A) (by C-G formula based on SCr of 1.62 mg/dL (H)). Liver Function Tests: No results for input(s): AST, ALT, ALKPHOS, BILITOT, PROT, ALBUMIN in the last 168 hours. No results for input(s): LIPASE, AMYLASE in the last 168 hours. No results for input(s): AMMONIA in the last 168 hours. Coagulation Profile: No results for input(s): INR, PROTIME in the last 168 hours. Cardiac Enzymes: No results for input(s): CKTOTAL, CKMB,  CKMBINDEX, TROPONINI in the last 168 hours. BNP (last 3 results) No results for input(s): PROBNP in the last 8760 hours. HbA1C: No results for input(s): HGBA1C in the last 72 hours. CBG: No results for input(s): GLUCAP in the last 168 hours. Lipid Profile: No results for input(s): CHOL, HDL, LDLCALC, TRIG, CHOLHDL, LDLDIRECT in the last 72 hours. Thyroid Function Tests: No results for input(s): TSH, T4TOTAL, FREET4, T3FREE, THYROIDAB in the last 72 hours. Anemia Panel: No results for input(s): VITAMINB12, FOLATE, FERRITIN, TIBC, IRON, RETICCTPCT in the last 72 hours. Sepsis Labs: No results for input(s): PROCALCITON, LATICACIDVEN in the last 168 hours.  No results found for this or any previous visit (from the past 240 hour(s)).       Radiology Studies: No results found.      Scheduled Meds: . atorvastatin  20 mg Oral Daily  . cholecalciferol  1,000 Units Oral Daily  . enoxaparin (LOVENOX) injection  0.5 mg/kg Subcutaneous Q24H  . fluticasone  2 spray Each Nare Daily  . loratadine  10 mg Oral Daily  . pantoprazole  40 mg Oral Daily  . sodium bicarbonate  650 mg Oral BID   Continuous  Infusions: . sodium chloride Stopped (06/26/20 1640)     LOS: 15 days    Time spent: 15 minutes    Max Sane, MD [facility] Pager [phone]  If 7PM-7AM, please contact night-coverage 07/03/2020, 1:15 PM

## 2020-07-03 NOTE — TOC Progression Note (Signed)
Transition of Care [facility]) - Progression Note    Patient Details  Name: Michelle Guerrero MRN: [id number] Date of Birth: [birth year]  Transition of Care [facility]) CM/SW Contact  Beverly Sessions, RN Phone Number: 07/03/2020, 4:28 PM  Clinical Narrative:     I have called Donte at [facility] [phone]) left a message and provided them [name] contact information for the determination as I will be unavailable   Expected Discharge Plan: [place] Barriers to Discharge: Continued Medical Work up  Expected Discharge Plan and Services Expected Discharge Plan: [place] Choice: [place] arrangements for the past 2 months: Single Family Home                                       Social Determinants of Health (SDOH) Interventions    Readmission Risk Interventions No flowsheet data found.

## 2020-07-04 LAB — SARS CORONAVIRUS 2 BY RT PCR (HOSPITAL ORDER, PERFORMED IN ~~LOC~~ HOSPITAL LAB): SARS Coronavirus 2: NEGATIVE

## 2020-07-04 NOTE — Progress Notes (Signed)
Called report to nurse Melissa at [facility] in [place], [place] where patient will be discharging today.  Dola Argyle, RN

## 2020-07-04 NOTE — Discharge Summary (Signed)
Michelle Guerrero at [facility] NAME: [name]    MR#:  [id number]  DATE OF BIRTH:  Apr 09, 1941  DATE OF ADMISSION:  06/17/2020   ADMITTING PHYSICIAN: Christel Mormon, MD  DATE OF DISCHARGE: 07/04/2020  PRIMARY CARE PHYSICIAN: Nanda Quinton, MD   ADMISSION DIAGNOSIS:  Colitis [K52.9] Abnormal CT scan [R93.89] Acute colitis [K52.9] Gallbladder mass [K82.8] Acute renal failure superimposed on chronic kidney disease, unspecified CKD stage, unspecified acute renal failure type ([place]) [N17.9, N18.9] DISCHARGE DIAGNOSIS:  Principal Problem:   Acute colitis Active Problems:   Type 2 diabetes mellitus with microalbuminuria (HCC)   CKD (chronic kidney disease), stage IV (HCC)   Essential hypertension   GERD (gastroesophageal reflux disease)   Acute renal failure superimposed on chronic kidney disease (HCC)   Acute pain of left knee   Weakness   Nausea and vomiting   Leukocytosis  SECONDARY DIAGNOSIS:   Past Medical History:  Diagnosis Date  . Basal cell carcinoma 12/21/2019   left ant shoulder  . 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 Patient was discharged from rehab where she was placed after repair of a hip fracture 1 day prior to her hospitalization.   Acute colitis with diarrhea Completed 5 days of Cipro/Flagyl Responded to cholestyramine 3 times daily Stool GI PCR and C. difficile negative Diarrhea resolved. stools are formed  Abnormal MRCP, concern for gallbladder malignancy General surgery saw patient here.  Refer to outpatient Daughter has appointment for November 8 with hepatobiliary surgeon  Intractable nausea  vomiting Resolved  Essential hypertension Blood pressure running low.    Stopping lisinopril, amlodipine, hydrochlorothiazide  Acute kidney failure chronic kidney disease stage IIIb Stable Creatinine at baseline  Persistent leukocytosis Neutrophilic predominance.  White cell count alternates between 12 and 15 Possible underlying hematologic malignancy Continue to monitor Referral to hematology on discharge  Hyperlipidemia Continue Lipitor  GERD PPI  History of skin cancer on nose and left arm Local wound care Outpatient Derm follow-up  Weakness Functional decline PT recommends rehab   DISCHARGE CONDITIONS:  Stable CONSULTS OBTAINED:   DRUG ALLERGIES:   Allergies  Allergen Reactions  . Penicillins   . Montelukast Nausea Only   DISCHARGE MEDICATIONS:   Allergies as of 07/04/2020      Reactions   Penicillins    Montelukast Nausea Only      Medication List    STOP taking these medications   amLODipine 5 MG tablet Commonly known as: NORVASC   doxycycline 100 MG tablet Commonly known as: ADOXA   hydrochlorothiazide 25 MG tablet Commonly known as: HYDRODIURIL   lisinopril 40 MG tablet Commonly known as: ZESTRIL     TAKE these medications   acetaminophen 500 MG tablet Commonly known as: TYLENOL Take 1,000 mg by mouth in the morning and at bedtime.   aspirin 325 MG tablet Take 325 mg by mouth in the morning and at bedtime.   cetirizine 10 MG tablet Commonly known as: ZYRTEC Take 10 mg by mouth daily.   cholecalciferol 25 MCG (1000 UNIT) tablet Commonly known as: VITAMIN D3 Take 1,000 Units by mouth daily.   mupirocin ointment 2 % Commonly known as: BACTROBAN Apply to  affected areas of wounds QD after cleaning.   omeprazole-sodium bicarbonate 40-1100 MG capsule Commonly known as: ZEGERID Take 1 capsule by mouth daily before breakfast.   simvastatin 40 MG tablet Commonly known as: ZOCOR Take 40 mg by mouth at bedtime.   sodium  bicarbonate 650 MG tablet Take 650 mg by mouth 2 (two) times daily.      DISCHARGE INSTRUCTIONS:   DIET:  Regular diet DISCHARGE CONDITION:  Stable ACTIVITY:  Activity as tolerated OXYGEN:  Home Oxygen: No.  Oxygen Delivery: room air DISCHARGE LOCATION:  SNF -[facility] in [place]    If you experience worsening of your admission symptoms, develop shortness of breath, life threatening emergency, suicidal or homicidal thoughts you must seek medical attention immediately by calling 911 or calling your MD immediately  if symptoms less severe.  You Must read complete instructions/literature along with all the possible adverse reactions/side effects for all the Medicines you take and that have been prescribed to you. Take any new Medicines after you have completely understood and accpet all the possible adverse reactions/side effects.   Please note  You were cared for by a hospitalist during your hospital stay. If you have any questions about your discharge medications or the care you received while you were in the hospital after you are discharged, you can call the unit and asked to speak with the hospitalist on call if the hospitalist that took care of you is not available. Once you are discharged, your primary care physician will handle any further medical issues. Please note that NO REFILLS for any discharge medications will be authorized once you are discharged, as it is imperative that you return to your primary care physician (or establish a relationship with a primary care physician if you do not have one) for your aftercare needs so that they can reassess your need for medications and monitor your lab values.    On the day of Discharge:  VITAL SIGNS:  Blood pressure (!) 113/52, pulse 84, temperature 98 F (36.7 C), temperature source Oral, resp. rate 18, height 5\' 2"  (1.575 m), weight 77.1 kg, SpO2 98 %. PHYSICAL EXAMINATION:  GENERAL:  79 y.o.-year-old patient lying in the bed  with no acute distress.  EYES: Pupils equal, round, reactive to light and accommodation. No scleral icterus. Extraocular muscles intact.  HEENT: Head atraumatic, normocephalic. Oropharynx and nasopharynx clear.  NECK:  Supple, no jugular venous distention. No thyroid enlargement, no tenderness.  LUNGS: Normal breath sounds bilaterally, no wheezing, rales,rhonchi or crepitation. No use of accessory muscles of respiration.  CARDIOVASCULAR: S1, S2 normal. No murmurs, rubs, or gallops.  ABDOMEN: Soft, non-tender, non-distended. Bowel sounds present. No organomegaly or mass.  EXTREMITIES: No pedal edema, cyanosis, or clubbing.  NEUROLOGIC: Cranial nerves II through XII are intact. Muscle strength 5/5 in all extremities. Sensation intact. Gait not checked.  PSYCHIATRIC: The patient is alert and oriented x 3.  SKIN: No obvious rash, lesion, or ulcer.  DATA REVIEW:   CBC No results for input(s): WBC, HGB, HCT, PLT in the last 168 hours.  Chemistries  No results for input(s): NA, K, CL, CO2, GLUCOSE, BUN, CREATININE, CALCIUM, MG, AST, ALT, ALKPHOS, BILITOT in the last 168 hours.  Invalid input(s): [place]   Outpatient follow-up  Contact information for follow-up providers    Nanda Quinton, MD. Schedule an appointment as soon as possible for a visit in 1 week(s).   Specialty: Family Medicine Contact information: [address] [place] [address] [phone]  Stark Klein, MD. Go on 07/14/2020.   Specialty: General Surgery Why: As scheduled Contact information:   [address] [phone]            Contact information for after-discharge care    Destination    [facility] .   Service: Skilled Nursing Contact information: [place] [phone]                   Management plans discussed with the patient, family and they are in agreement.  CODE STATUS: Full  Code   TOTAL TIME TAKING CARE OF THIS PATIENT: 45 minutes.    Max Sane M.D on 07/04/2020 at 12:54 PM  [facility]   CC: Primary care physician; Nanda Quinton, MD   Note: This dictation was prepared with Dragon dictation along with smaller phrase technology. Any transcriptional errors that result from this process are unintentional.

## 2020-07-04 NOTE — TOC Transition Note (Signed)
Transition of Care [facility]) - CM/SW Discharge Note   Patient Details  Name: Michelle Guerrero MRN: [id number] Date of Birth: 1940-10-23  [facility] CM/SW Contact:  Meriel Flavors, LCSW Phone Number: 07/04/2020, 3:45 PM   Clinical Narrative:    Patient discharging today being transported by first choice to [facility] in [place]. RM# [id number] per Kristi [phone]   Final next level of care: Skilled Nursing Facility Barriers to Discharge: Continued Medical Work up   Patient Goals and CMS Choice Patient states their goals for this hospitalization and ongoing recovery are:: to get services she needs CMS Medicare.gov Compare Post Acute Care list provided to:: Patient Represenative (must comment) Choice offered to / list presented to : Patient, Adult Children  Discharge Placement                       Discharge Plan and Services     Post Acute Care Choice: [place]                               Social Determinants of Health (SDOH) Interventions     Readmission Risk Interventions No flowsheet data found.

## 2020-07-04 NOTE — Progress Notes (Signed)
Mobility Specialist - Progress Note   07/04/20 1352  Mobility  Activity Refused mobility  Mobility performed by Mobility specialist    Pt refused session at this time d/t being discharged in a few hours. Nurse was notified.    Zyler Hyson Mobility Specialist  07/04/20, 1:53 PM

## 2020-07-07 ENCOUNTER — Ambulatory Visit: Payer: Medicare HMO | Admitting: Radiation Oncology

## 2020-07-17 DIAGNOSIS — N179 Acute kidney failure, unspecified: Secondary | ICD-10-CM | POA: Insufficient documentation

## 2020-08-04 ENCOUNTER — Ambulatory Visit: Payer: Medicare HMO | Admitting: Dermatology

## 2020-08-05 ENCOUNTER — Other Ambulatory Visit: Payer: Self-pay

## 2020-08-05 ENCOUNTER — Encounter: Payer: Self-pay | Admitting: Internal Medicine

## 2020-08-05 ENCOUNTER — Inpatient Hospital Stay: Payer: Medicare HMO

## 2020-08-05 ENCOUNTER — Inpatient Hospital Stay: Payer: Medicare HMO | Attending: Internal Medicine | Admitting: Internal Medicine

## 2020-08-05 VITALS — BP 152/60 | HR 74 | Temp 97.9°F

## 2020-08-05 DIAGNOSIS — D649 Anemia, unspecified: Secondary | ICD-10-CM | POA: Diagnosis not present

## 2020-08-05 DIAGNOSIS — Z79899 Other long term (current) drug therapy: Secondary | ICD-10-CM | POA: Diagnosis not present

## 2020-08-05 DIAGNOSIS — D729 Disorder of white blood cells, unspecified: Secondary | ICD-10-CM | POA: Diagnosis not present

## 2020-08-05 DIAGNOSIS — Z85828 Personal history of other malignant neoplasm of skin: Secondary | ICD-10-CM | POA: Insufficient documentation

## 2020-08-05 DIAGNOSIS — Z7982 Long term (current) use of aspirin: Secondary | ICD-10-CM | POA: Insufficient documentation

## 2020-08-05 DIAGNOSIS — I129 Hypertensive chronic kidney disease with stage 1 through stage 4 chronic kidney disease, or unspecified chronic kidney disease: Secondary | ICD-10-CM | POA: Diagnosis present

## 2020-08-05 DIAGNOSIS — N183 Chronic kidney disease, stage 3 unspecified: Secondary | ICD-10-CM | POA: Insufficient documentation

## 2020-08-05 DIAGNOSIS — D631 Anemia in chronic kidney disease: Secondary | ICD-10-CM | POA: Diagnosis not present

## 2020-08-05 DIAGNOSIS — Z87891 Personal history of nicotine dependence: Secondary | ICD-10-CM | POA: Diagnosis not present

## 2020-08-05 DIAGNOSIS — K219 Gastro-esophageal reflux disease without esophagitis: Secondary | ICD-10-CM | POA: Diagnosis not present

## 2020-08-05 DIAGNOSIS — E119 Type 2 diabetes mellitus without complications: Secondary | ICD-10-CM | POA: Diagnosis not present

## 2020-08-05 DIAGNOSIS — E785 Hyperlipidemia, unspecified: Secondary | ICD-10-CM | POA: Diagnosis not present

## 2020-08-05 DIAGNOSIS — K529 Noninfective gastroenteritis and colitis, unspecified: Secondary | ICD-10-CM | POA: Diagnosis not present

## 2020-08-05 LAB — IRON AND TIBC
Iron: 48 ug/dL (ref 28–170)
Saturation Ratios: 18 % (ref 10.4–31.8)
TIBC: 260 ug/dL (ref 250–450)
UIBC: 212 ug/dL

## 2020-08-05 LAB — COMPREHENSIVE METABOLIC PANEL
ALT: 24 U/L (ref 0–44)
AST: 32 U/L (ref 15–41)
Albumin: 3.4 g/dL — ABNORMAL LOW (ref 3.5–5.0)
Alkaline Phosphatase: 96 U/L (ref 38–126)
Anion gap: 7 (ref 5–15)
BUN: 29 mg/dL — ABNORMAL HIGH (ref 8–23)
CO2: 28 mmol/L (ref 22–32)
Calcium: 8.9 mg/dL (ref 8.9–10.3)
Chloride: 102 mmol/L (ref 98–111)
Creatinine, Ser: 1.4 mg/dL — ABNORMAL HIGH (ref 0.44–1.00)
GFR, Estimated: 39 mL/min — ABNORMAL LOW (ref >60)
Glucose, Bld: 124 mg/dL — ABNORMAL HIGH (ref 70–99)
Potassium: 4.5 mmol/L (ref 3.5–5.1)
Sodium: 137 mmol/L (ref 135–145)
Total Bilirubin: 0.4 mg/dL (ref 0.3–1.2)
Total Protein: 8.4 g/dL — ABNORMAL HIGH (ref 6.5–8.1)

## 2020-08-05 LAB — CBC WITH DIFFERENTIAL/PLATELET
Abs Immature Granulocytes: 0.04 10*3/uL (ref 0.00–0.07)
Basophils Absolute: 0.1 10*3/uL (ref 0.0–0.1)
Basophils Relative: 1 %
Eosinophils Absolute: 1 10*3/uL — ABNORMAL HIGH (ref 0.0–0.5)
Eosinophils Relative: 7 %
HCT: 41.3 % (ref 36.0–46.0)
Hemoglobin: 13 g/dL (ref 12.0–15.0)
Immature Granulocytes: 0 %
Lymphocytes Relative: 21 %
Lymphs Abs: 2.8 10*3/uL (ref 0.7–4.0)
MCH: 29.9 pg (ref 26.0–34.0)
MCHC: 31.5 g/dL (ref 30.0–36.0)
MCV: 94.9 fL (ref 80.0–100.0)
Monocytes Absolute: 0.8 10*3/uL (ref 0.1–1.0)
Monocytes Relative: 6 %
Neutro Abs: 8.7 10*3/uL — ABNORMAL HIGH (ref 1.7–7.7)
Neutrophils Relative %: 65 %
Platelets: 406 10*3/uL — ABNORMAL HIGH (ref 150–400)
RBC: 4.35 MIL/uL (ref 3.87–5.11)
RDW: 15 % (ref 11.5–15.5)
WBC: 13.5 10*3/uL — ABNORMAL HIGH (ref 4.0–10.5)
nRBC: 0 % (ref 0.0–0.2)

## 2020-08-05 LAB — FOLATE: Folate: 7.9 ng/mL (ref >5.9)

## 2020-08-05 LAB — VITAMIN B12: Vitamin B-12: 717 pg/mL (ref 180–914)

## 2020-08-05 LAB — LACTATE DEHYDROGENASE: LDH: 117 U/L (ref 98–192)

## 2020-08-05 LAB — FERRITIN: Ferritin: 94 ng/mL (ref 11–307)

## 2020-08-05 NOTE — Progress Notes (Signed)
[facility] CONSULT NOTE  Patient Care Team: Luiz Ochoa, MD as PCP - General (Family Medicine) Carmina Miller, MD as Radiation Oncologist (Radiation Oncology) Willeen Niece, MD (Dermatology)  CHIEF COMPLAINTS/PURPOSE OF CONSULTATION: Neutrophilia/anemia   HEMATOLOGY HISTORY  # LEUCOCYTOSIS/neutrophilia-White count up to 15,000  #Anemia- ~9-10/CKD stage III  #Diabetes; CKD stage III;   HISTORY OF PRESENTING ILLNESS:  Michelle Guerrero 79 y.o.  female pleasant patient was been referred to [place] for further evaluation of direct platelets anemia which was incidentally found on blood work when she was admitted to hospital.  Patient was recently admitted to hospital in end of October 2021-with acute colitis; acute on chronic renal failure.  Patient is CT scan that showed significant colitis.  Patient is currently in a rehab in [place] city.  Patient denies any fevers or chills.  No nausea no vomiting.  No significant weight loss.  No night sweats.  Of note patient had a fall prior for which she had left hip ORIF.  Reviewed the patient's history from the recent hospital admission/summarized above.    Review of Systems  Constitutional: Positive for malaise/fatigue and weight loss. Negative for chills, diaphoresis and fever.  HENT: Negative for nosebleeds and sore throat.   Eyes: Negative for double vision.  Respiratory: Negative for cough, hemoptysis, sputum production, shortness of breath and wheezing.   Cardiovascular: Negative for chest pain, palpitations, orthopnea and leg swelling.  Gastrointestinal: Negative for abdominal pain, blood in stool, constipation, diarrhea, heartburn, melena, nausea and vomiting.  Genitourinary: Negative for dysuria, frequency and urgency.  Musculoskeletal: Positive for back pain and joint pain.  Skin: Negative.  Negative for itching and rash.  Neurological: Negative for dizziness, tingling, focal weakness, weakness and headaches.   Endo/Heme/Allergies: Does not bruise/bleed easily.  Psychiatric/Behavioral: Negative for depression. The patient is not nervous/anxious and does not have insomnia.      MEDICAL HISTORY:  Past Medical History:  Diagnosis Date  . Basal cell carcinoma 12/21/2019   left ant shoulder  . Chronic kidney disease   . Diabetes mellitus without complication (HCC)   . GERD (gastroesophageal reflux disease)   . Hyperlipidemia   . Hypertension   . Noninfective gastroenteritis and colitis, unspecified   . Other specified diseases of gallbladder   . 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  SOCIAL HISTORY: Social History   Socioeconomic History  . Marital status: Married    Spouse name: Not on file  . Number of children: Not on file  . Years of education: Not on file  . Highest education level: Not on file  Occupational History  . Not on file  Tobacco Use  . Smoking status: Former Games developer  . Smokeless tobacco: Never Used  . Tobacco comment: 20 years ago  Substance and Sexual Activity  . Alcohol use: Not Currently  . Drug use: Not Currently  . Sexual activity: Not Currently  Other Topics Concern  . Not on file  Social History Narrative   Used to work in a [employer] farm; quit smoking > many years ago; No alcohol; husband in [place]. Currently in [place] [employer]   Social Determinants of Health   Financial Resource Strain:   . Difficulty of Paying Living Expenses: Not on file  Food Insecurity:   . Worried About Programme researcher, broadcasting/film/video in the Last Year: Not on file  . Ran Out  of Food in the Last Year: Not on file  Transportation Needs:   . Lack of Transportation (Medical): Not on file  . Lack of Transportation (Non-Medical): Not on file  Physical Activity:   . Days of Exercise per Week: Not on file  . Minutes of Exercise per Session: Not on file  Stress:   . Feeling of Stress  : Not on file  Social Connections:   . Frequency of Communication with Friends and Family: Not on file  . Frequency of Social Gatherings with Friends and Family: Not on file  . Attends Religious Services: Not on file  . Active Member of Clubs or Organizations: Not on file  . Attends Archivist Meetings: Not on file  . Marital Status: Not on file  Intimate Partner Violence:   . Fear of Current or Ex-Partner: Not on file  . Emotionally Abused: Not on file  . Physically Abused: Not on file  . Sexually Abused: Not on file    FAMILY HISTORY: No family history on file.  ALLERGIES:  is allergic to nutra balance diabetic-fiber [nutritional supplements], penicillins, and montelukast.  MEDICATIONS:  Current Outpatient Medications  Medication Sig Dispense Refill  . acetaminophen (TYLENOL) 500 MG tablet Take 1,000 mg by mouth in the morning and at bedtime.    Marland Kitchen aspirin 325 MG tablet Take 325 mg by mouth in the morning and at bedtime.    . cetirizine (ZYRTEC) 10 MG tablet Take 10 mg by mouth daily.    . mupirocin ointment (BACTROBAN) 2 % Apply to affected areas of wounds QD after cleaning. 60 g 4  . omeprazole-sodium bicarbonate (ZEGERID) 40-1100 MG capsule Take 1 capsule by mouth daily before breakfast.    . ondansetron (ZOFRAN-ODT) 4 MG disintegrating tablet Take 4 mg by mouth every 8 (eight) hours as needed for nausea or vomiting.    . simvastatin (ZOCOR) 40 MG tablet Take 40 mg by mouth at bedtime.    . sodium bicarbonate 650 MG tablet Take 650 mg by mouth 2 (two) times daily.     No current facility-administered medications for this visit.     PHYSICAL EXAMINATION:   Vitals:   08/05/20 1409  BP: (!) 152/60  Pulse: 74  Temp: 97.9 F (36.6 C)  SpO2: 100%   Filed Weights    Physical Exam Constitutional:      Comments: Patient is in a wheelchair.  Accompanied by bus driver.  HENT:     Head: Normocephalic and atraumatic.     Mouth/Throat:     Pharynx: No  oropharyngeal exudate.  Eyes:     Pupils: Pupils are equal, round, and reactive to light.  Cardiovascular:     Rate and Rhythm: Normal rate and regular rhythm.  Pulmonary:     Effort: No respiratory distress.     Breath sounds: No wheezing.     Comments: Decreased breath sounds bilaterally. Abdominal:     General: Bowel sounds are normal. There is no distension.     Palpations: Abdomen is soft. There is no mass.     Tenderness: There is no abdominal tenderness. There is no guarding or rebound.  Musculoskeletal:        General: No tenderness. Normal range of motion.     Cervical back: Normal range of motion and neck supple.  Skin:    General: Skin is warm.     Comments: Patient has chronic nodular lesions on the face-/bridge of the nose suggestive of BCC/SCC  Neurological:  Mental Status: She is alert and oriented to person, place, and time.  Psychiatric:        Mood and Affect: Affect normal.      LABORATORY DATA:  I have reviewed the data as listed Lab Results  Component Value Date   WBC 13.4 (H) 06/24/2020   HGB 10.2 (L) 06/24/2020   HCT 31.3 (L) 06/24/2020   MCV 91.3 06/24/2020   PLT 324 06/24/2020   Recent Labs    06/17/20 2201 06/19/20 0343 06/21/20 0400 06/24/20 0334 06/26/20 0551  NA 136   < > 134* 137 136  K 3.7   < > 4.0 3.9 4.2  CL 101   < > 102 104 105  CO2 20*   < > 21* 21* 21*  GLUCOSE 100*   < > 100* 95 97  BUN 40*   < > 25* 24* 27*  CREATININE 1.83*   < > 1.32* 1.45* 1.62*  CALCIUM 9.2   < > 8.6* 8.6* 8.6*  GFRNONAA 26*   < > 39* 35* 30*  PROT 8.3*  --   --   --   --   ALBUMIN 3.5  --   --   --   --   AST 19  --   --   --   --   ALT 12  --   --   --   --   ALKPHOS 124  --   --   --   --   BILITOT 0.8  --   --   --   --    < > = values in this interval not displayed.     No results found.  ASSESSMENT & PLAN:   Neutrophilia # NEUTROPHILIA: Chronic white count anywhere between 12-13,000.  Baseline.  Appears reactive rather than any  malignant process.  Check CBC CMP LDH peripheral smear; CRP  # ANEMIA- sec to CKD-III. recommend checking multiple myeloma panel kappa lambda light chain.   # BCC of face: Recommend follow-up with dermatology; previously evaluated at [facility].  Thank you Dr.Shah for allowing me to participate in the care of your pleasant patient. Please do not hesitate to contact me with questions or concerns in the interim.   # DISPOSITION: [id number] # labs today- cbc/cmp/peripehral smear/ iron studies/ferritin/CRP; MM panel; K/l light chains;LDH- # follow up TBD- Dr.B      Cammie Sickle, MD 08/05/2020 2:58 PM

## 2020-08-05 NOTE — Assessment & Plan Note (Addendum)
#  NEUTROPHILIA: Chronic white count anywhere between 12-13,000.  Baseline.  Appears reactive rather than any malignant process.  Check CBC CMP LDH peripheral smear; CRP  # ANEMIA- sec to CKD-III. recommend checking multiple myeloma panel kappa lambda light chain.   # BCC of face: Recommend follow-up with dermatology; previously evaluated at [facility].  Thank you Dr.Shah for allowing me to participate in the care of your pleasant patient. Please do not hesitate to contact me with questions or concerns in the interim.   # DISPOSITION: [phone]-Judy/daughter # labs today- cbc/cmp/peripehral smear/ iron studies/ferritin/CRP; MM panel; K/l light chains;LDH- # follow up TBD- Dr.B  

## 2020-08-06 LAB — KAPPA/LAMBDA LIGHT CHAINS
Kappa free light chain: 123.5 mg/L — ABNORMAL HIGH (ref 3.3–19.4)
Kappa, lambda light chain ratio: 2.48 — ABNORMAL HIGH (ref 0.26–1.65)
Lambda free light chains: 49.8 mg/L — ABNORMAL HIGH (ref 5.7–26.3)

## 2020-08-07 LAB — MULTIPLE MYELOMA PANEL, SERUM
Albumin SerPl Elph-Mcnc: 3.1 g/dL (ref 2.9–4.4)
Albumin/Glob SerPl: 0.7 (ref 0.7–1.7)
Alpha 1: 0.4 g/dL (ref 0.0–0.4)
Alpha2 Glob SerPl Elph-Mcnc: 1 g/dL (ref 0.4–1.0)
B-Globulin SerPl Elph-Mcnc: 1.4 g/dL — ABNORMAL HIGH (ref 0.7–1.3)
Gamma Glob SerPl Elph-Mcnc: 1.7 g/dL (ref 0.4–1.8)
Globulin, Total: 4.5 g/dL — ABNORMAL HIGH (ref 2.2–3.9)
IgA: 967 mg/dL — ABNORMAL HIGH (ref 64–422)
IgG (Immunoglobin G), Serum: 1593 mg/dL (ref 586–1602)
IgM (Immunoglobulin M), Srm: 139 mg/dL (ref 26–217)
Total Protein ELP: 7.6 g/dL (ref 6.0–8.5)

## 2020-08-08 ENCOUNTER — Telehealth: Payer: Self-pay | Admitting: Internal Medicine

## 2020-08-08 DIAGNOSIS — D729 Disorder of white blood cells, unspecified: Secondary | ICD-10-CM

## 2020-08-08 DIAGNOSIS — D649 Anemia, unspecified: Secondary | ICD-10-CM

## 2020-08-08 NOTE — Telephone Encounter (Signed)
On 12/02-spoke to patient's daughter [name] regarding patient's mild leukocytosis; most likely reactive-hold any further work-up.  Chronic kidney disease.  Recommend follow-up in 6 months.  C-schedule follow-up in 6 months -MD; CBC CMP/LDH.  Thanks GB

## 2020-08-08 NOTE — Telephone Encounter (Signed)
Labs entered.

## 2020-08-08 NOTE — Addendum Note (Signed)
Addended by: Delice Bison E on: 08/08/2020 08:22 AM   Modules accepted: Orders

## 2020-08-12 ENCOUNTER — Encounter: Payer: Self-pay | Admitting: Radiation Oncology

## 2020-08-13 ENCOUNTER — Ambulatory Visit
Admit: 2020-08-13 | Discharge: 2020-08-13 | Disposition: A | Discharge status: Home / Self Care | Payer: Medicare HMO | Attending: Radiation Oncology | Admitting: Radiation Oncology

## 2020-08-13 DIAGNOSIS — C44619 Basal cell carcinoma of skin of left upper limb, including shoulder: Secondary | ICD-10-CM

## 2020-08-19 ENCOUNTER — Telehealth: Payer: Self-pay | Admitting: Internal Medicine

## 2020-08-19 NOTE — Telephone Encounter (Signed)
On 12/13-left a voicemail for the patient's daughter [name] to discuss concerns of gallbladder malignancy on recent imaging.  She will need evaluation at tertiary center.   Discussed with Dr. Peyton Najjar.   Drue Dun- please follow up with the daughter re: above referral. Please talk to me when you get a chance.   GB

## 2020-08-19 NOTE — Telephone Encounter (Signed)
Called and spoke with daughter Michelle Guerrero. She is aware that her MRI was suspicious for gallbladder cancer. She had an appointment with Dr. Barry Dienes in December and had to cancel it due to an illness. It was rescheduled for 09/19/20. Reached out to May at [facility] and she was able to move appointment to 09/10/20. She was provided the contact information to [facility] to arrange transportation for this new appointment.

## 2020-08-20 ENCOUNTER — Ambulatory Visit: Admission: RE | Admit: 2020-08-20 | Payer: Medicare HMO | Source: Ambulatory Visit | Admitting: Radiation Oncology

## 2020-09-17 ENCOUNTER — Other Ambulatory Visit: Payer: Self-pay | Admitting: General Surgery

## 2020-09-17 DIAGNOSIS — K828 Other specified diseases of gallbladder: Secondary | ICD-10-CM

## 2020-10-01 ENCOUNTER — Other Ambulatory Visit: Payer: Self-pay

## 2020-10-01 ENCOUNTER — Ambulatory Visit
Admission: RE | Admit: 2020-10-01 | Discharge: 2020-10-01 | Disposition: A | Discharge status: Home / Self Care | Payer: Medicare HMO | Source: Ambulatory Visit | Attending: Radiation Oncology | Admitting: Radiation Oncology

## 2020-10-01 ENCOUNTER — Ambulatory Visit (INDEPENDENT_AMBULATORY_CARE_PROVIDER_SITE_OTHER): Payer: Medicare HMO | Admitting: Dermatology

## 2020-10-01 ENCOUNTER — Other Ambulatory Visit: Payer: Self-pay | Admitting: Dermatology

## 2020-10-01 ENCOUNTER — Encounter: Payer: Self-pay | Admitting: Radiation Oncology

## 2020-10-01 VITALS — BP 134/76 | HR 95 | Temp 96.9°F | Wt 155.0 lb

## 2020-10-01 DIAGNOSIS — L570 Actinic keratosis: Secondary | ICD-10-CM

## 2020-10-01 DIAGNOSIS — C44619 Basal cell carcinoma of skin of left upper limb, including shoulder: Secondary | ICD-10-CM

## 2020-10-01 DIAGNOSIS — Z923 Personal history of irradiation: Secondary | ICD-10-CM | POA: Insufficient documentation

## 2020-10-01 DIAGNOSIS — L989 Disorder of the skin and subcutaneous tissue, unspecified: Secondary | ICD-10-CM | POA: Insufficient documentation

## 2020-10-01 DIAGNOSIS — C44629 Squamous cell carcinoma of skin of left upper limb, including shoulder: Secondary | ICD-10-CM | POA: Diagnosis not present

## 2020-10-01 DIAGNOSIS — L821 Other seborrheic keratosis: Secondary | ICD-10-CM

## 2020-10-01 DIAGNOSIS — K829 Disease of gallbladder, unspecified: Secondary | ICD-10-CM | POA: Diagnosis not present

## 2020-10-01 DIAGNOSIS — Z85828 Personal history of other malignant neoplasm of skin: Secondary | ICD-10-CM | POA: Diagnosis not present

## 2020-10-01 DIAGNOSIS — L820 Inflamed seborrheic keratosis: Secondary | ICD-10-CM

## 2020-10-01 DIAGNOSIS — L578 Other skin changes due to chronic exposure to nonionizing radiation: Secondary | ICD-10-CM

## 2020-10-01 DIAGNOSIS — C4492 Squamous cell carcinoma of skin, unspecified: Secondary | ICD-10-CM

## 2020-10-01 NOTE — Progress Notes (Signed)
   Follow-Up Visit   Subjective  Michelle Guerrero is a 80 y.o. female who presents for the following: Skin Cancer (Hx BCC/SCC. L ant shoulder, L mid forearm radiation with Dr. Baruch Gouty, using mupirocin ointment and calcium alginate.). Follow up appointment with Dr. Baruch Gouty this morning. Advised patient the area appears to be healing normally. She has been discharged from his care  The following portions of the chart were reviewed this encounter and updated as appropriate:      Review of Systems: No other skin or systemic complaints except as noted in HPI or Assessment and Plan.  Objective  Well appearing patient in no apparent distress; mood and affect are within normal limits.  A focused examination was performed including face, left shoulder, left arm. Relevant physical exam findings are noted in the Assessment and Plan.  Objective  Left Shoulder - Anterior: Erythematous plaque with focal crusting, no evidence of recurrence     Objective  Left Forearm - Posterior: Pink plaque with mild scale     Objective  left malar cheek x1, left paranasal x1 (2): Erythematous hyperkeratotic or waxy stuck-on nodules/plaques with oozing. Tender to touch.  Images    Objective  Right Hand - Posterior: Keratotic nodule   Assessment & Plan     Basal cell carcinoma (BCC) of skin of left upper extremity including shoulder Left Shoulder - Anterior  Clear. Observe for recurrence. Call clinic for new or changing lesions.  Recommend regular skin exams, daily broad-spectrum spf 30+ sunscreen use, and photoprotection.     Continue Mupirocin ointment and non stick pad until healed.  Squamous cell carcinoma of skin Left Forearm - Posterior  Clear. Good result with radiation.  Observe for recurrence. Call clinic for new or changing lesions.  Recommend regular skin exams, daily broad-spectrum spf 30+ sunscreen use, and photoprotection.     Inflamed seborrheic keratosis (2) left malar cheek  x1, left paranasal x1  Vrs SCC Recommend biopsy to r/o SCC.  Pt declines biopsy today.  Will readdress bx on f/up if not improved.  Bacterial C&S performed at left malar cheek. ISK, R/O infection.  Left cheek: cleanse 1-2 times daily with warm compresses and gentle debridement, apply Mupirocin ointment bid.  Other Related Procedures Anaerobic and Aerobic Culture  AK (actinic keratosis) Right Hand - Posterior  Hypertrophic  Pt agrees to cryotherapy today.  If not improved on f/up will attempt biopsy.  Destruction of lesion - Right Hand - Posterior  Destruction method: cryotherapy   Informed consent: discussed and consent obtained   Lesion destroyed using liquid nitrogen: Yes   Region frozen until ice ball extended beyond lesion: Yes   Outcome: patient tolerated procedure well with no complications   Post-procedure details: wound care instructions given    Return in about 2 months (around 11/29/2020) for BCC/SCC recheck.   Actinic Damage face/arms - chronic, secondary to cumulative UV radiation exposure/sun exposure over time - diffuse scaly erythematous macules with underlying dyspigmentation - Recommend daily broad spectrum sunscreen SPF 30+ to sun-exposed areas, reapply every 2 hours as needed.  - Call for new or changing lesions.  Seborrheic Keratoses - Stuck-on, waxy, tan-brown papules and plaques face  - Discussed benign etiology and prognosis. - Observe - Call for any changes   Documentation: I have reviewed the above documentation for accuracy and completeness, and I agree with the above.  Brendolyn Patty MD

## 2020-10-01 NOTE — Patient Instructions (Addendum)
Cryotherapy Aftercare  . Wash gently with soap and water everyday.   Marland Kitchen Apply Vaseline and Band-Aid daily until healed.  Prior to procedure, discussed risks of blister formation, small wound, skin dyspigmentation, or rare scar following cryotherapy.   Continue Mupirocin ointment and non stick pad as directed to left shoulder until healed.  Left cheek: cleanse 1-2 times daily with warm compresses and gentle debridement, apply Mupirocin ointment.

## 2020-10-01 NOTE — Progress Notes (Signed)
Radiation Oncology Follow up Note  Name: Michelle Guerrero   Date:   10/01/2020 MRN:  [id number] DOB: 1941/04/08    This 80 y.o. female presents to the clinic today for 32-month follow-up status post ration therapy for basal cell carcinoma left anterior shoulder and a squamous cell carcinoma left forearm.  REFERRING PROVIDER: Nanda Quinton, MD  HPI: Patient is a 80 year old female now out 5 months having completed radiation therapy to his significantly locally advanced basal cell carcinoma of the left anterior shoulder.  We also treated squamous cell carcinoma the left mid forearm.  Both areas have responded well.  Her left shoulder is bandaged although it is markedly improved from initial presentation..  Interestingly she had back in October and MRI of her abdomen as well as CT scan showing avidly enhancing multifocal masslike foci of wall thickening throughout the gallbladder measuring 4.5 cm suspicious for gallbladder malignancy.  She has been referred to a tertiary center for work-up of that.  She also has marked pathology of her f facial skin being followed by [facility] dermatology department and treated.  COMPLICATIONS OF TREATMENT: none  FOLLOW UP COMPLIANCE: keeps appointments   PHYSICAL EXAM:  BP 134/76   Pulse 95   Temp (!) 96.9 F (36.1 C) (Tympanic)   Wt 155 lb (70.3 kg)   BMI 28.35 kg/m  Well-developed frail-appearing wheelchair-bound female in NAD.  Numerous plaque-like facial skin growths are noted.  Left shoulder is bandaged although appears markedly improved.  Forearm lesion is cleared.  Well-developed well-nourished patient in NAD. HEENT reveals PERLA, EOMI, discs not visualized.  Oral cavity is clear. No oral mucosal lesions are identified. Neck is clear without evidence of cervical or supraclavicular adenopathy. Lungs are clear to A&P. Cardiac examination is essentially unremarkable with regular rate and rhythm without murmur rub or thrill. Abdomen is benign with no organomegaly  or masses noted. Motor sensory and DTR levels are equal and symmetric in the upper and lower extremities. Cranial nerves II through XII are grossly intact. Proprioception is intact. No peripheral adenopathy or edema is identified. No motor or sensory levels are noted. Crude visual fields are within normal range.  RADIOLOGY RESULTS: CT scans and MRI of pelvis reviewed compatible with above-stated findings  PLAN: Present time patient will be seeking tertiary center opinion about possible surgery regarding her gallbladder.  At this time I am going to turn follow-up care over to medical oncology.  Would be happy to reevaluate the patient anytime should further treatment be indicated.  I would like to take this opportunity to thank you for allowing me to participate in the care of your patient.Noreene Filbert, MD

## 2020-10-03 ENCOUNTER — Ambulatory Visit
Admission: RE | Admit: 2020-10-03 | Discharge: 2020-10-03 | Disposition: A | Discharge status: Home / Self Care | Payer: Medicare HMO | Source: Ambulatory Visit | Attending: General Surgery | Admitting: General Surgery

## 2020-10-03 DIAGNOSIS — K828 Other specified diseases of gallbladder: Secondary | ICD-10-CM

## 2020-10-06 LAB — ANAEROBIC AND AEROBIC CULTURE

## 2020-10-07 ENCOUNTER — Telehealth: Payer: Self-pay

## 2020-10-07 NOTE — Telephone Encounter (Signed)
-----   Message from Brendolyn Patty, MD sent at 10/07/2020  3:52 PM EST ----- Positive for p.acnes Start benzoyl peroxide cleanser to wash face qd,  Start Doxycycline mono 100 mg PO bid with food x 2 weeks.

## 2020-10-07 NOTE — Telephone Encounter (Signed)
Called patient's daughter, [name], to discuss culture results and she asked me to call [facility] to give results. Advised Megan at [facility] of patient's culture results. Verbal order given for doxycycline monohydrate 100mg  1 po BID with food x 2 weeks #28 0Rf and benzoyl peroxide to wash face once daily.

## 2020-10-16 ENCOUNTER — Other Ambulatory Visit (HOSPITAL_COMMUNITY): Payer: Self-pay | Admitting: General Surgery

## 2020-10-16 ENCOUNTER — Other Ambulatory Visit: Payer: Self-pay | Admitting: General Surgery

## 2020-10-16 DIAGNOSIS — K828 Other specified diseases of gallbladder: Secondary | ICD-10-CM

## 2020-10-20 ENCOUNTER — Ambulatory Visit: Payer: Medicare HMO

## 2020-10-28 ENCOUNTER — Other Ambulatory Visit: Payer: Self-pay | Admitting: General Surgery

## 2020-10-28 ENCOUNTER — Ambulatory Visit
Admission: RE | Admit: 2020-10-28 | Discharge: 2020-10-28 | Disposition: A | Discharge status: Home / Self Care | Payer: Medicare HMO | Source: Ambulatory Visit | Attending: General Surgery | Admitting: General Surgery

## 2020-10-28 ENCOUNTER — Other Ambulatory Visit: Payer: Self-pay

## 2020-10-28 DIAGNOSIS — K828 Other specified diseases of gallbladder: Secondary | ICD-10-CM

## 2020-10-28 IMAGING — MR MR ABDOMEN W/O CM
3 series · 45 of 48 positions shown · non-contrast
Comparison: [DATE] MRI abdomen.

CLINICAL DATA: Follow-up enhancing gallbladder masses.

EXAM:
MRI ABDOMEN WITHOUT CONTRAST
TECHNIQUE: Multiplanar multisequence MR imaging was performed without the
administration of intravenous contrast. Examination was discontinued
early at the patient's request.

[Series 2: cor haste · coronal · 6.0mm · 1.19mm/px · 16 of 32 slices shown]
[im 1/32]
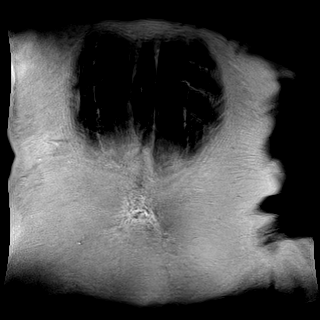
[im 3/32]
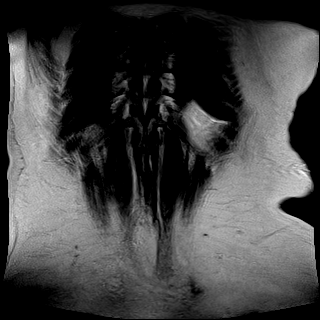
[im 5/32]
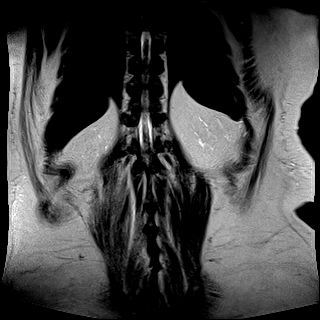
[im 7/32]
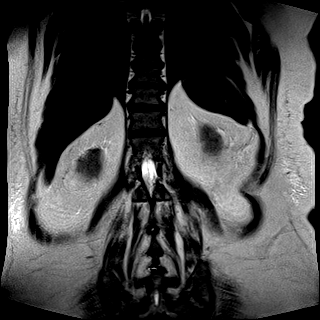
[im 9/32]
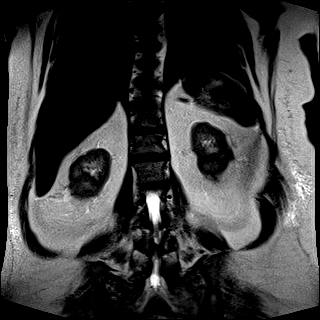
[im 11/32]
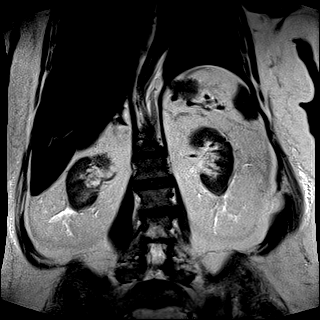
[im 13/32]
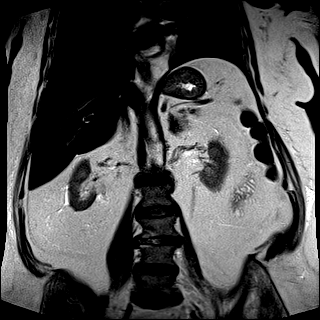
[im 15/32]
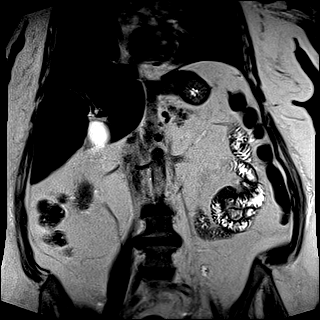
[im 17/32]
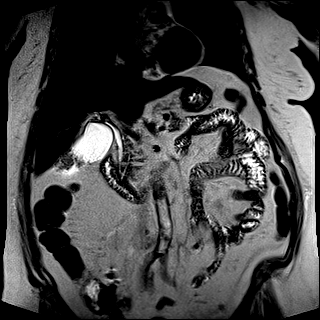
[im 19/32]
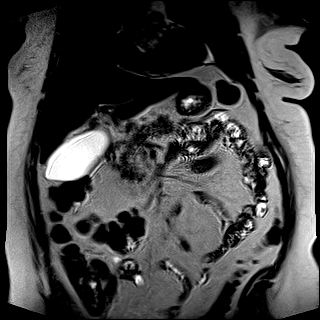
[im 21/32]
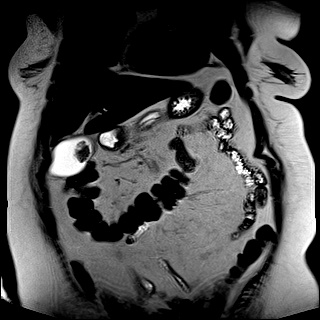
[im 23/32]
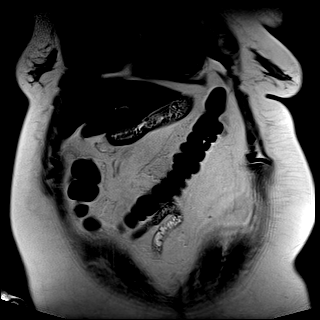
[im 25/32]
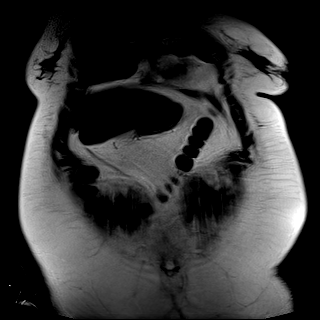
[im 27/32]
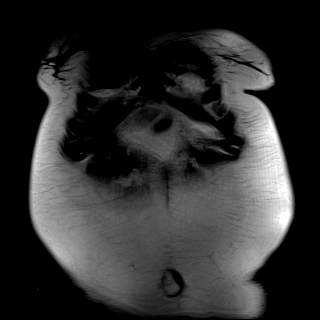
[im 29/32]
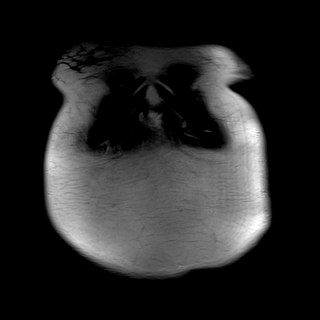
[im 32/32]
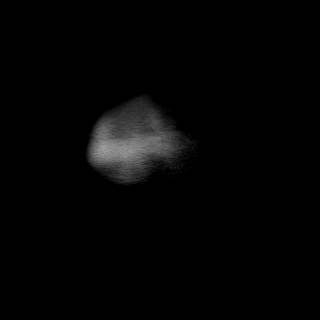

[Series 3: ax haste · axial · 6.5mm · 1.19mm/px · z∈[-95,+146]mm · 16 of 32 slices shown]
[im 1/32]
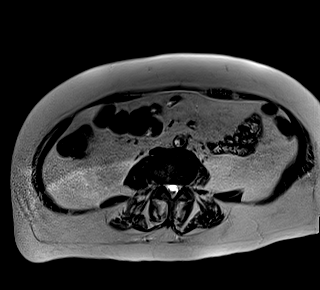
[im 3/32]
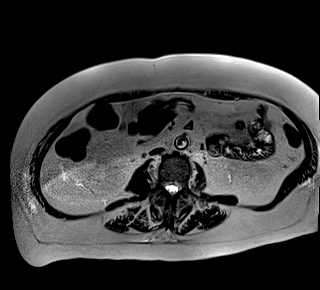
[im 5/32]
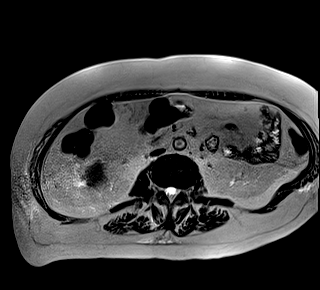
[im 7/32]
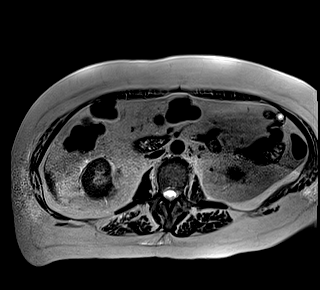
[im 9/32]
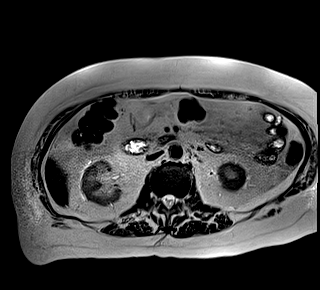
[im 11/32]
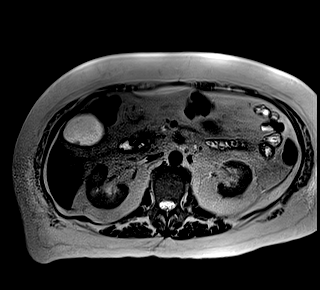
[im 13/32]
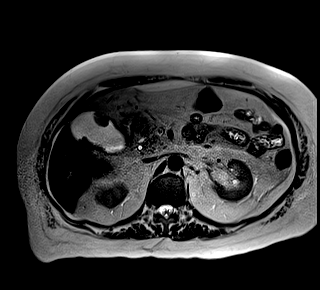
[im 15/32]
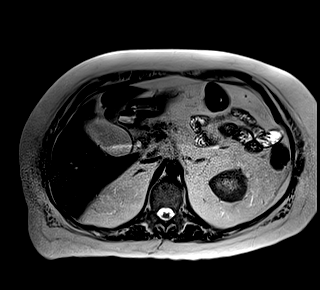
[im 17/32]
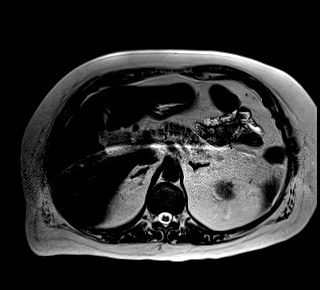
[im 19/32]
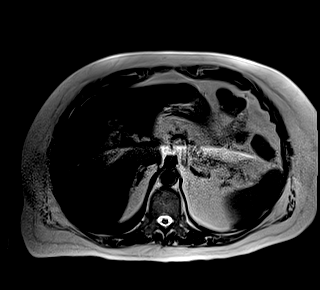
[im 21/32]
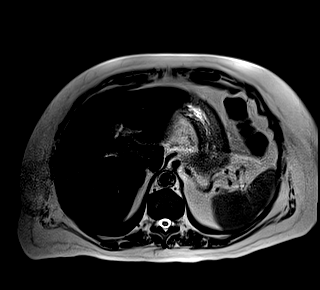
[im 23/32]
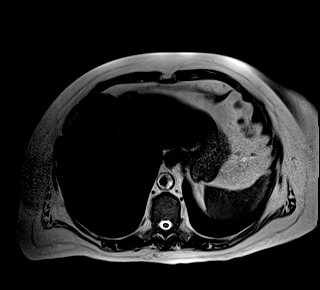
[im 25/32]
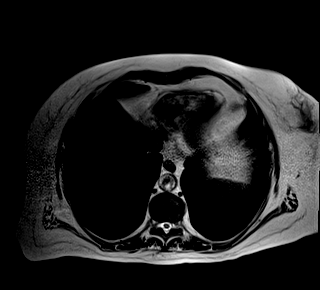
[im 27/32]
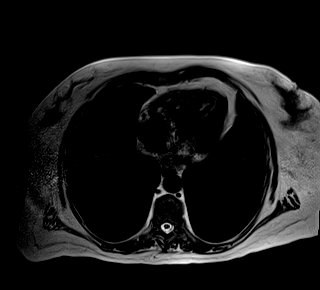
[im 29/32]
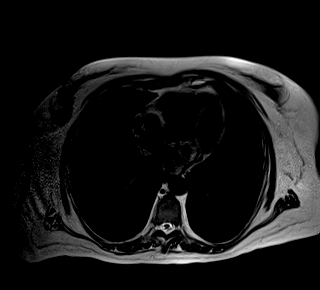
[im 32/32]
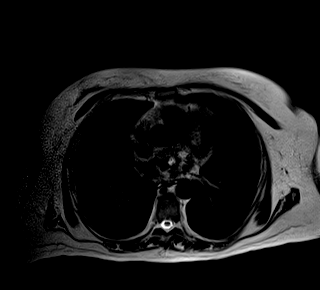

[Series 6: T2 fat-sat · axial · 6.5mm · 1.19mm/px · z∈[-90,+151]mm · 13 of 32 slices shown]
[im 1/32]
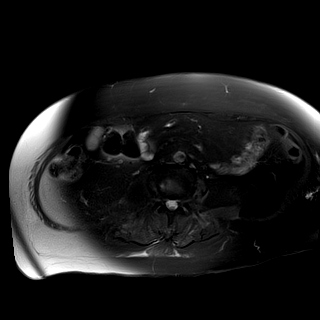
[im 3/32]
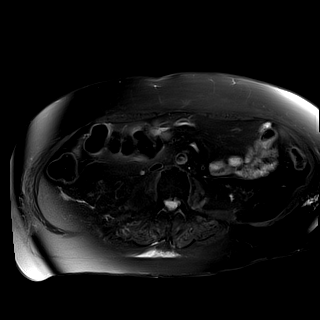
[im 5/32]
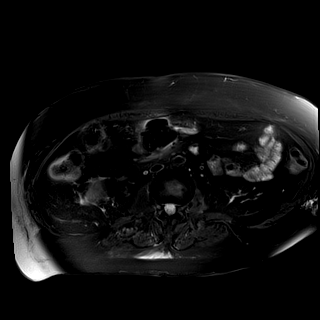
[im 7/32]
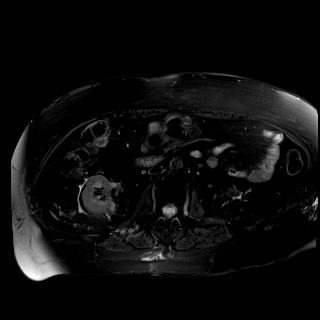
[im 9/32]
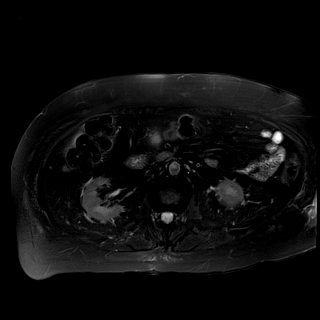
[im 11/32]
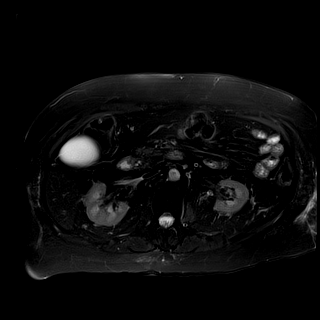
[im 13/32]
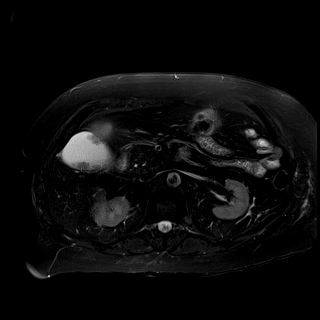
[im 15/32]
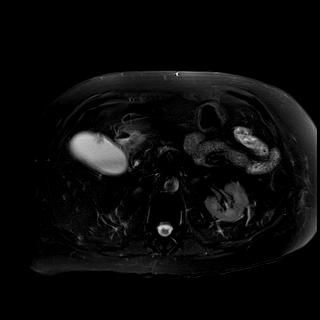
[im 17/32]
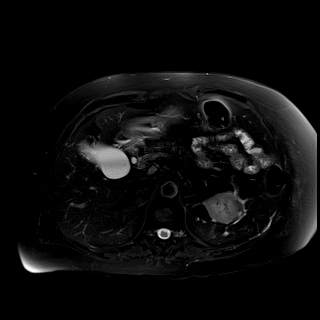
[im 19/32]
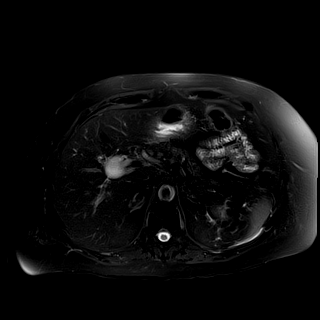
[im 23/32]
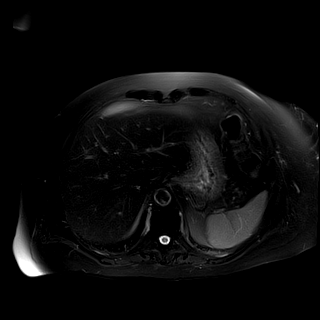
[im 27/32]
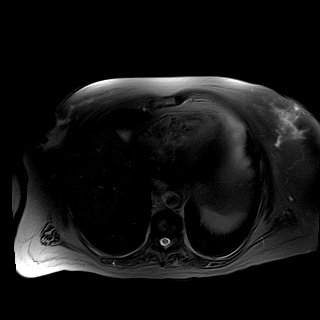
[im 32/32]
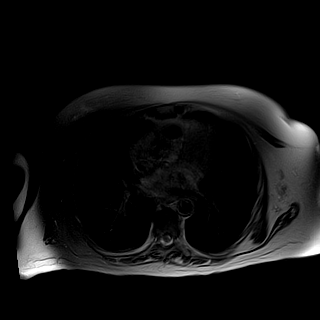

[45 of 48 positions shown; findings below may reference images not displayed]

FINDINGS: Very limited scan due to early study discontinuation at the
patient's request. Axial and coronal T2 imaging obtained. No T1
imaging, postcontrast imaging, diffusion imaging or chemical shift
imaging was obtained.

Lower chest: No acute abnormality at the lung bases.

Hepatobiliary: Normal liver size and configuration. No liver mass.
Persistent anterior gallbladder wall 2.4 x 1.2 cm irregular mass
(series 6/image 19), previously 2.2 x 1.3 cm, not substantially
changed. Persistent irregular sessile posterior gallbladder 4.4 x
1.0 cm mass (series 6/image 20), previously 4.5 x 1.0 cm, not
substantially changed. No pericholecystic fluid. No gallstones. No
biliary ductal dilatation. Common bile duct diameter 5 mm. No
choledocholithiasis. No biliary masses, strictures or beading.

Pancreas: No pancreatic mass or duct dilation.  No pancreas divisum.

Spleen: Normal size. No mass.

Adrenals/Urinary Tract: Normal adrenals. No hydronephrosis. Normal
kidneys with no renal mass.

Stomach/Bowel: Normal non-distended stomach. Visualized small and
large bowel is normal caliber, with no bowel wall thickening.

Vascular/Lymphatic: Normal caliber abdominal aorta. No
pathologically enlarged lymph nodes in the abdomen.

Other: No abdominal ascites or focal fluid collection.

Musculoskeletal: No aggressive appearing focal osseous lesions.
IMPRESSION: 1. Very limited scan due to early study discontinuation at the
patient's request.
2. No significant change in size of persistent irregular anterior
and posterior gallbladder wall masses since [DATE] MRI.
Gallbladder malignancy remains on the differential given
demonstration of avid enhancement within these masses on prior scan.
3. No abdominal adenopathy or other potential findings of metastatic
disease on this noncontrast MRI study.

## 2020-10-28 MED ORDER — GADOBUTROL 1 MMOL/ML IV SOLN: 7.0000 mL | Freq: Once | INTRAVENOUS | Status: DC | PRN

## 2020-12-02 ENCOUNTER — Other Ambulatory Visit: Payer: Self-pay

## 2020-12-02 ENCOUNTER — Ambulatory Visit (INDEPENDENT_AMBULATORY_CARE_PROVIDER_SITE_OTHER): Payer: Medicare HMO | Admitting: Dermatology

## 2020-12-02 DIAGNOSIS — D0461 Carcinoma in situ of skin of right upper limb, including shoulder: Secondary | ICD-10-CM

## 2020-12-02 DIAGNOSIS — C44619 Basal cell carcinoma of skin of left upper limb, including shoulder: Secondary | ICD-10-CM

## 2020-12-02 DIAGNOSIS — L578 Other skin changes due to chronic exposure to nonionizing radiation: Secondary | ICD-10-CM

## 2020-12-02 DIAGNOSIS — Z85828 Personal history of other malignant neoplasm of skin: Secondary | ICD-10-CM | POA: Diagnosis not present

## 2020-12-02 DIAGNOSIS — L570 Actinic keratosis: Secondary | ICD-10-CM | POA: Diagnosis not present

## 2020-12-02 DIAGNOSIS — Z8589 Personal history of malignant neoplasm of other organs and systems: Secondary | ICD-10-CM

## 2020-12-02 DIAGNOSIS — D485 Neoplasm of uncertain behavior of skin: Secondary | ICD-10-CM

## 2020-12-02 NOTE — Progress Notes (Signed)
Follow-Up Visit   Subjective  Michelle Guerrero is a 80 y.o. female who presents for the following: Follow-up.  Recheck face and R hand.  H/o BCC/SCC.  Face improved with 5% BPO wash, Doxycycline 100 mg Bid x 2 weeks and mupirocin ointment.  Spot on hand did not clear up with freezing.   The following portions of the chart were reviewed this encounter and updated as appropriate:       Review of Systems:  No other skin or systemic complaints except as noted in HPI or Assessment and Plan.  Objective  Well appearing patient in no apparent distress; mood and affect are within normal limits.  A focused examination was performed including face, hands, arms, shoulder. Relevant physical exam findings are noted in the Assessment and Plan.  Objective  Left Anterior Shoulder: Erythematous plaque with focal superficial ulceration and crusting, no evidence of recurrence       Objective  Right Hand Dorsum: 3.2 x 1.5cm keratotic nodule     Objective  R 4th finger at base x 1, L malar cheek x 1, R zygoma x 1, R malar cheek x 1, nasal tip x 1, L medial cheek x 1, L zygoma x 1 (7): Keratotic papules/nodules.  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  Good result with radiation. Healing well. Observe for recurrence. Call clinic for new or changing lesions.  Recommend regular skin exams, daily broad-spectrum spf 30+ sunscreen use, and photoprotection.    Continue Mupirocin ointment and non stick pad qd until healed.    Neoplasm of uncertain behavior of skin Right Hand Dorsum  Epidermal / dermal shaving  Lesion diameter (cm):  3.2 Informed consent: discussed and consent obtained   Patient was prepped and draped in usual sterile fashion: Area prepped with alcohol. Anesthesia: the lesion was anesthetized in a standard fashion    Anesthetic:  1% lidocaine w/ epinephrine 1-100,000 buffered w/ 8.4% NaHCO3 Instrument used: flexible razor blade   Hemostasis achieved with: pressure, aluminum chloride and electrodesiccation   Outcome: patient tolerated procedure well   Post-procedure details: wound care instructions given   Post-procedure details comment:  Ointment and small bandage applied Additional details:  Biopsy went down to fat layer.  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 Continue BPO wash qd and mupirocin ointment qd  Prior to procedure, discussed risks of blister formation, small wound, skin dyspigmentation, or rare scar following cryotherapy.   Will try cryotherapy first to see how much clearance we can get.  Will address possible biopsies on f/up is not improved.  Destruction of lesion - R 4th finger at base x 1, L malar cheek x 1, R zygoma x 1, R malar cheek x 1, nasal tip x 1, L medial cheek x 1, L zygoma x 1  Destruction method: cryotherapy  Informed consent: discussed and consent obtained   Lesion destroyed using liquid nitrogen: Yes   Region frozen until ice ball extended beyond lesion: Yes   Outcome: patient tolerated procedure well with no complications   Post-procedure details: wound care instructions given   Additional details:  Mupirocin qd  History of squamous cell carcinoma Left Forearm  Clear. Good result with radiation.  Observe for recurrence. Call clinic for new or changing lesions.  Recommend regular skin  exams, daily broad-spectrum spf 30+ sunscreen use, and photoprotection  Actinic Damage - chronic, secondary to cumulative UV radiation exposure/sun exposure over time - diffuse scaly erythematous macules with underlying dyspigmentation - Recommend daily broad spectrum sunscreen SPF 30+ to sun-exposed areas, reapply every 2 hours as needed.  - Recommend staying in the shade or wearing long sleeves, sun glasses (UVA+UVB protection) and wide brim hats (4-inch brim around the entire circumference of the hat). - Call for new or changing lesions.  Return in about 3 months (around 03/04/2021) for SCC/BCC.   IJamesetta Orleans, CMA, am acting as scribe for Brendolyn Patty, MD .  Documentation: I have reviewed the above documentation for accuracy and completeness, and I agree with the above.  Brendolyn Patty MD

## 2020-12-02 NOTE — Patient Instructions (Addendum)
Mupirocin 2% Ointment - Apply once a day to affected areas on face, left shoulder and right hand and cover until healed.  Continue Benzoyl Peroxide 5% wash to face daily.  Wound Care Instructions  1. Cleanse wound gently with soap and water once a day then pat dry with clean gauze. Apply a thing coat of Petrolatum (petroleum jelly, "Vaseline") over the wound (unless you have an allergy to this). We recommend that you use a new, sterile tube of Vaseline. Do not pick or remove scabs. Do not remove the yellow or white "healing tissue" from the base of the wound.  2. Cover the wound with fresh, clean, nonstick gauze and secure with paper tape. You may use Band-Aids in place of gauze and tape if the would is small enough, but would recommend trimming much of the tape off as there is often too much. Sometimes Band-Aids can irritate the skin.  3. You should call the office for your biopsy report after 1 week if you have not already been contacted.  4. If you experience any problems, such as abnormal amounts of bleeding, swelling, significant bruising, significant pain, or evidence of infection, please call the office immediately.  5. FOR ADULT SURGERY PATIENTS: If you need something for pain relief you may take 1 extra strength Tylenol (acetaminophen) AND 2 Ibuprofen (200mg  each) together every 4 hours as needed for pain. (do not take these if you are allergic to them or if you have a reason you should not take them.) Typically, you may only need pain medication for 1 to 3 days.     If you have any questions or concerns for your doctor, please call our main line at [phone] and press option 4 to reach your doctor's medical assistant. If no one answers, please leave a voicemail as directed and we will return your call as soon as possible. Messages left after 4 pm will be answered the following business day.   You may also send [place] a message via [place]. We typically respond to MyChart messages within  1-2 business days.  For prescription refills, please ask your pharmacy to contact our office. Our fax number is [phone].  If you have an urgent issue when the clinic is closed that cannot wait until the next business day, you can page your doctor at the number below.    Please note that while we do our best to be available for urgent issues outside of office hours, we are not available 24/7.   If you have an urgent issue and are unable to reach [place], you may choose to seek medical care at your doctor's office, retail clinic, urgent care center, or emergency room.  If you have a medical emergency, please immediately call 911 or go to the emergency department.  Pager Numbers  - Dr. Nehemiah Massed: [phone]  - Dr. Laurence Ferrari: [phone]  - Dr. Nicole Kindred: [phone]  In the event of inclement weather, please call our main line at [phone] for an update on the status of any delays or closures.  Dermatology Medication Tips: Please keep the boxes that topical medications come in in order to help keep track of the instructions about where and how to use these. Pharmacies typically print the medication instructions only on the boxes and not directly on the medication tubes.   If your medication is too expensive, please contact our office at [phone] option 4 or send [place] a message through [place].   We are unable to tell what your co-pay  for medications will be in advance as this is different depending on your insurance coverage. However, we may be able to find a substitute medication at lower cost or fill out paperwork to get insurance to cover a needed medication.   If a prior authorization is required to get your medication covered by your insurance company, please allow [place] 1-2 business days to complete this process.  Drug prices often vary depending on where the prescription is filled and some pharmacies may offer cheaper prices.  The website www.goodrx.com contains coupons for  medications through different pharmacies. The prices here do not account for what the cost may be with help from insurance (it may be cheaper with your insurance), but the website can give you the price if you did not use any insurance.  - You can print the associated coupon and take it with your prescription to the pharmacy.  - You may also stop by our office during regular business hours and pick up a GoodRx coupon card.  - If you need your prescription sent electronically to a different pharmacy, notify our office through [facility] or by phone at [phone] option 4.

## 2020-12-08 ENCOUNTER — Telehealth: Payer: Self-pay

## 2020-12-08 NOTE — Telephone Encounter (Signed)
Advised pt's daughter of bx results./sh 

## 2020-12-08 NOTE — Telephone Encounter (Signed)
-----   Message from Brendolyn Patty, MD sent at 12/08/2020 10:22 AM EDT ----- Skin , right hand dorsum SQUAMOUS CELL CARCINOMA IN SITU, PERIPHERAL MARGIN INVOLVED  SCCIS- shave removal to fat and edc at edges at time of biopsy.  Will monitor for recurrence and recheck on f/up.

## 2021-02-10 ENCOUNTER — Inpatient Hospital Stay: Payer: Medicare HMO | Attending: Internal Medicine

## 2021-02-10 ENCOUNTER — Encounter: Payer: Self-pay | Admitting: Internal Medicine

## 2021-02-10 ENCOUNTER — Inpatient Hospital Stay (HOSPITAL_BASED_OUTPATIENT_CLINIC_OR_DEPARTMENT_OTHER): Payer: Medicare HMO | Admitting: Internal Medicine

## 2021-02-10 VITALS — BP 165/75 | HR 76 | Temp 97.8°F | Resp 18 | Wt 167.4 lb

## 2021-02-10 DIAGNOSIS — E119 Type 2 diabetes mellitus without complications: Secondary | ICD-10-CM | POA: Diagnosis not present

## 2021-02-10 DIAGNOSIS — I129 Hypertensive chronic kidney disease with stage 1 through stage 4 chronic kidney disease, or unspecified chronic kidney disease: Secondary | ICD-10-CM | POA: Insufficient documentation

## 2021-02-10 DIAGNOSIS — D729 Disorder of white blood cells, unspecified: Secondary | ICD-10-CM | POA: Diagnosis not present

## 2021-02-10 DIAGNOSIS — Z85828 Personal history of other malignant neoplasm of skin: Secondary | ICD-10-CM | POA: Diagnosis not present

## 2021-02-10 DIAGNOSIS — M79605 Pain in left leg: Secondary | ICD-10-CM | POA: Diagnosis not present

## 2021-02-10 DIAGNOSIS — Z79899 Other long term (current) drug therapy: Secondary | ICD-10-CM | POA: Insufficient documentation

## 2021-02-10 DIAGNOSIS — D72829 Elevated white blood cell count, unspecified: Secondary | ICD-10-CM | POA: Insufficient documentation

## 2021-02-10 DIAGNOSIS — K219 Gastro-esophageal reflux disease without esophagitis: Secondary | ICD-10-CM | POA: Insufficient documentation

## 2021-02-10 DIAGNOSIS — D649 Anemia, unspecified: Secondary | ICD-10-CM

## 2021-02-10 DIAGNOSIS — Z7982 Long term (current) use of aspirin: Secondary | ICD-10-CM | POA: Insufficient documentation

## 2021-02-10 DIAGNOSIS — N183 Chronic kidney disease, stage 3 unspecified: Secondary | ICD-10-CM | POA: Diagnosis not present

## 2021-02-10 DIAGNOSIS — R1901 Right upper quadrant abdominal swelling, mass and lump: Secondary | ICD-10-CM | POA: Insufficient documentation

## 2021-02-10 LAB — COMPREHENSIVE METABOLIC PANEL
ALT: 15 U/L (ref 0–44)
AST: 19 U/L (ref 15–41)
Albumin: 3.7 g/dL (ref 3.5–5.0)
Alkaline Phosphatase: 98 U/L (ref 38–126)
Anion gap: 12 (ref 5–15)
BUN: 38 mg/dL — ABNORMAL HIGH (ref 8–23)
CO2: 24 mmol/L (ref 22–32)
Calcium: 9.5 mg/dL (ref 8.9–10.3)
Chloride: 100 mmol/L (ref 98–111)
Creatinine, Ser: 1.17 mg/dL — ABNORMAL HIGH (ref 0.44–1.00)
GFR, Estimated: 48 mL/min — ABNORMAL LOW (ref >60)
Glucose, Bld: 132 mg/dL — ABNORMAL HIGH (ref 70–99)
Potassium: 5.2 mmol/L — ABNORMAL HIGH (ref 3.5–5.1)
Sodium: 136 mmol/L (ref 135–145)
Total Bilirubin: 0.5 mg/dL (ref 0.3–1.2)
Total Protein: 8.6 g/dL — ABNORMAL HIGH (ref 6.5–8.1)

## 2021-02-10 LAB — CBC WITH DIFFERENTIAL/PLATELET
Abs Immature Granulocytes: 0.1 10*3/uL — ABNORMAL HIGH (ref 0.00–0.07)
Basophils Absolute: 0.1 10*3/uL (ref 0.0–0.1)
Basophils Relative: 1 %
Eosinophils Absolute: 0.5 10*3/uL (ref 0.0–0.5)
Eosinophils Relative: 3 %
HCT: 42.1 % (ref 36.0–46.0)
Hemoglobin: 13.8 g/dL (ref 12.0–15.0)
Immature Granulocytes: 1 %
Lymphocytes Relative: 18 %
Lymphs Abs: 3.1 10*3/uL (ref 0.7–4.0)
MCH: 30.4 pg (ref 26.0–34.0)
MCHC: 32.8 g/dL (ref 30.0–36.0)
MCV: 92.7 fL (ref 80.0–100.0)
Monocytes Absolute: 0.9 10*3/uL (ref 0.1–1.0)
Monocytes Relative: 5 %
Neutro Abs: 12.3 10*3/uL — ABNORMAL HIGH (ref 1.7–7.7)
Neutrophils Relative %: 72 %
Platelets: 377 10*3/uL (ref 150–400)
RBC: 4.54 MIL/uL (ref 3.87–5.11)
RDW: 11.9 % (ref 11.5–15.5)
WBC: 17 10*3/uL — ABNORMAL HIGH (ref 4.0–10.5)
nRBC: 0 % (ref 0.0–0.2)

## 2021-02-10 LAB — LACTATE DEHYDROGENASE: LDH: 111 U/L (ref 98–192)

## 2021-02-10 NOTE — Progress Notes (Signed)
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 Dr,Byerley]  HISTORY OF PRESENTING ILLNESS: Patient is a poor historian/accompanied by driver.   Michelle Guerrero 80 y.o.  female with multiple medical problems is here for follow-up with regards to neutrophilia/likely gallbladder malignancy  In the interim patient was evaluated by surgery-poor candidate for surgery with regards to her gallbladder malignancy.  Patient continues to live in the nursing home.  Complains of left leg pain attributable to her hip surgery.  Currently physical therapy.    Review of Systems  Constitutional:  Positive for malaise/fatigue and weight loss. Negative for chills, diaphoresis and fever.  HENT:  Negative for nosebleeds and sore throat.   Eyes:  Negative for double vision.  Respiratory:  Negative for cough, hemoptysis, sputum production, shortness of breath and wheezing.   Cardiovascular:  Negative for chest pain, palpitations, orthopnea and leg swelling.  Gastrointestinal:  Negative for abdominal pain, blood in stool, constipation, diarrhea, heartburn, melena, nausea and vomiting.  Genitourinary:  Negative for dysuria, frequency and urgency.  Musculoskeletal:  Positive for back pain and joint pain.  Skin: Negative.  Negative for itching and rash.  Neurological:  Negative for dizziness, tingling, focal weakness, weakness and headaches.  Endo/Heme/Allergies:  Does not bruise/bleed easily.  Psychiatric/Behavioral:  Negative for depression. The patient is not  nervous/anxious and does not have insomnia.     MEDICAL HISTORY:  Past Medical History:  Diagnosis Date   Basal cell carcinoma 12/21/2019   left ant shoulder. 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 Radiation   Squamous cell carcinoma of skin 12/02/2020   R hand dorsum, SCCIS, EDC    SURGICAL HISTORY: History reviewed. No pertinent surgical history.  SOCIAL HISTORY: Social History   Socioeconomic History   Marital status: Married    Spouse name: Not on file   Number of children: Not on file   Years of education: Not on file   Highest education level: Not on file  Occupational History   Not on file  Tobacco Use   Smoking status: Former    Pack years: 0.00   Smokeless tobacco: Never   Tobacco comments:    20 years ago  Substance and Sexual Activity   Alcohol use: Not Currently   Drug use: Not Currently   Sexual activity: Not Currently  Other Topics Concern   Not on file  Social History Narrative   Used to work in a [employer]; quit smoking > many years ago; No alcohol; husband in [place]. Currently in [place] Strain: Not on file  Food Insecurity: Not on file  Transportation Needs: Not on file  Physical Activity: Not on file  Stress: Not on file  Social Connections: Not on file  Intimate  Partner Violence: Not on file    FAMILY HISTORY: History reviewed. No pertinent family history.  ALLERGIES:  is allergic to nutra balance diabetic-fiber [nutritional supplements], penicillins, and montelukast.  MEDICATIONS:  Current Outpatient Medications  Medication Sig Dispense Refill    acetaminophen (TYLENOL) 325 MG tablet Take 325 mg by mouth every 6 (six) hours as needed. Give 2 tablets by mouth every 6 hours as needed for pain     acetaminophen (TYLENOL) 500 MG tablet Take 1,000 mg by mouth in the morning and at bedtime.     Aspirin 81 MG CAPS Take 81 mg by mouth daily.     cetirizine (ZYRTEC) 10 MG tablet Take 10 mg by mouth daily.     diphenoxylate-atropine (LOMOTIL) 2.5-0.025 MG tablet Take by mouth 4 (four) times daily as needed for diarrhea or loose stools.     mupirocin ointment (BACTROBAN) 2 % Apply to affected areas of wounds QD after cleaning. 60 g 4   ondansetron (ZOFRAN-ODT) 4 MG disintegrating tablet Take 4 mg by mouth every 8 (eight) hours as needed for nausea or vomiting.     simvastatin (ZOCOR) 40 MG tablet Take 40 mg by mouth at bedtime.     sodium bicarbonate 650 MG tablet Take 650 mg by mouth 2 (two) times daily.     aspirin 325 MG tablet Take 325 mg by mouth in the morning and at bedtime. (Patient not taking: Reported on 02/10/2021)     doxycycline (MONODOX) 100 MG capsule Take 100 mg by mouth 2 (two) times daily. (Patient not taking: Reported on 02/10/2021)     doxycycline (VIBRAMYCIN) 100 MG capsule Take 100 mg by mouth daily. (Patient not taking: Reported on 02/10/2021)     omeprazole-sodium bicarbonate (ZEGERID) 40-1100 MG capsule Take 1 capsule by mouth daily before breakfast. (Patient not taking: Reported on 02/10/2021)     simvastatin (ZOCOR) 20 MG tablet Take 20 mg by mouth at bedtime. (Patient not taking: Reported on 02/10/2021)     No current facility-administered medications for this visit.     PHYSICAL EXAMINATION:   Vitals:   02/10/21 1325  BP: (!) 165/75  Pulse: 76  Resp: 18  Temp: 97.8 F (36.6 C)  SpO2: 100%   Filed Weights   02/10/21 1325  Weight: 167 lb 6.4 oz (75.9 kg)    Physical Exam Constitutional:      Comments: Patient is in a wheelchair.  Accompanied by bus driver.  HENT:     Head: Normocephalic and atraumatic.      Mouth/Throat:     Pharynx: No oropharyngeal exudate.  Eyes:     Pupils: Pupils are equal, round, and reactive to light.  Cardiovascular:     Rate and Rhythm: Normal rate and regular rhythm.  Pulmonary:     Effort: No respiratory distress.     Breath sounds: No wheezing.     Comments: Decreased breath sounds bilaterally. Abdominal:     General: Bowel sounds are normal. There is no distension.     Palpations: Abdomen is soft. There is no mass.     Tenderness: no abdominal tenderness There is no guarding or rebound.  Musculoskeletal:        General: No tenderness. Normal range of motion.     Cervical back: Normal range of motion and neck supple.  Skin:    General: Skin is warm.     Comments: Patient has chronic nodular lesions on the face-/bridge of the nose suggestive of BCC/SCC  Neurological:  Mental Status: She is alert and oriented to person, place, and time.  Psychiatric:        Mood and Affect: Affect normal.     LABORATORY DATA:  I have reviewed the data as listed Lab Results  Component Value Date   WBC 17.0 (H) 02/10/2021   HGB 13.8 02/10/2021   HCT 42.1 02/10/2021   MCV 92.7 02/10/2021   PLT 377 02/10/2021   Recent Labs    06/17/20 2201 06/19/20 0343 06/26/20 0551 08/05/20 1523 02/10/21 1305  NA 136   < > 136 137 136  K 3.7   < > 4.2 4.5 5.2*  CL 101   < > 105 102 100  CO2 20*   < > 21* 28 24  GLUCOSE 100*   < > 97 124* 132*  BUN 40*   < > 27* 29* 38*  CREATININE 1.83*   < > 1.62* 1.40* 1.17*  CALCIUM 9.2   < > 8.6* 8.9 9.5  GFRNONAA 26*   < > 30* 39* 48*  PROT 8.3*  --   --  8.4* 8.6*  ALBUMIN 3.5  --   --  3.4* 3.7  AST 19  --   --  32 19  ALT 12  --   --  24 15  ALKPHOS 124  --   --  96 98  BILITOT 0.8  --   --  0.4 0.5   < > = values in this interval not displayed.     No results found.  ASSESSMENT & PLAN:   Neutrophilia # NEUTROPHILIA: Chronic white count anywhere between 12-13,000.  Baseline.  Appears reactive.  White count 17;  BCR-ABL next visit  # Gallbladder mass/concerning for malignancy [facility] Feb 2022]/Dr.Cintron s/p surgical evaluation.  We will plan for surgery.  Discussed with patient's daughter  ## Left leg swelling ?DVT vs sec to Hip fracture/PT-declines ultrasound  # BCC of face: Recommend follow-up with dermatology; previously evaluated at [facility].  # DISPOSITION: [id number] # follow up in 6 months- MD; labs- cbc/cmp/CRP; Bcr-ABL FISH;      Cammie Sickle, MD 02/22/2021 6:47 PM

## 2021-02-10 NOTE — Assessment & Plan Note (Addendum)
#   NEUTROPHILIA: Chronic white count anywhere between 12-13,000.  Baseline.  Appears reactive.  White count 17; BCR-ABL next visit ° °# Gallbladder mass/concerning for malignancy [[facility] Feb 2022]/Dr.Cintron s/p surgical evaluation.  We will plan for surgery.  Discussed with patient's daughter ° °## Left leg swelling ?DVT vs sec to Hip fracture/PT-declines ultrasound ° °# BCC of face: Recommend follow-up with dermatology; previously evaluated at [facility]. ° °# DISPOSITION: [phone]-Judy/daughter °# follow up in 6 months- MD; labs- cbc/cmp/CRP; Bcr-ABL FISH;  ° ° °

## 2021-03-10 ENCOUNTER — Ambulatory Visit: Payer: Medicare HMO | Admitting: Dermatology

## 2021-03-31 ENCOUNTER — Other Ambulatory Visit: Payer: Self-pay | Admitting: General Surgery

## 2021-03-31 DIAGNOSIS — K828 Other specified diseases of gallbladder: Secondary | ICD-10-CM

## 2021-04-10 ENCOUNTER — Other Ambulatory Visit (HOSPITAL_COMMUNITY): Payer: Self-pay | Admitting: General Surgery

## 2021-04-10 DIAGNOSIS — K828 Other specified diseases of gallbladder: Secondary | ICD-10-CM

## 2021-04-15 ENCOUNTER — Ambulatory Visit (HOSPITAL_COMMUNITY)
Admission: RE | Admit: 2021-04-15 | Discharge: 2021-04-15 | Disposition: A | Discharge status: Home / Self Care | Payer: Medicare HMO | Source: Ambulatory Visit | Attending: General Surgery | Admitting: General Surgery

## 2021-04-15 ENCOUNTER — Other Ambulatory Visit: Payer: Self-pay

## 2021-04-15 DIAGNOSIS — K828 Other specified diseases of gallbladder: Secondary | ICD-10-CM | POA: Diagnosis not present

## 2021-04-15 LAB — POCT I-STAT CREATININE: Creatinine, Ser: 1.3 mg/dL — ABNORMAL HIGH (ref 0.44–1.00)

## 2021-04-15 IMAGING — CT CT ABD-PEL WO/W CM
3 of 15 series · 11 of 46 positions shown, 17 images · IV contrast (APPLIED)
Comparison: MRI abdomen dated [DATE] and [DATE]. CT
abdomen/pelvis dated [DATE] and [DATE].

CLINICAL DATA: Gallbladder mass

EXAM:
CT ABDOMEN AND PELVIS WITHOUT AND WITH CONTRAST
TECHNIQUE: Multidetector CT imaging of the abdomen and pelvis was performed
following the standard protocol before and following the bolus
administration of intravenous contrast.
CONTRAST:  100mL OMNIPAQUE IOHEXOL 350 MG/ML SOLN

[Series 7: axial arterial · axial · arterial · 0.93mm/px · z∈[-764,-416]mm · 5 of 175 slices shown]
[im 30/175  soft-tissue]
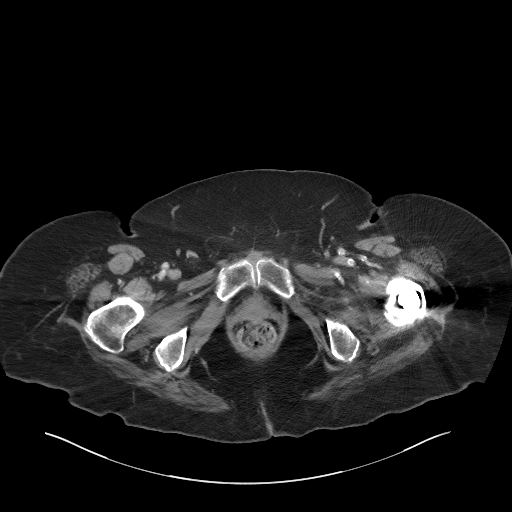
[im 59/175  soft-tissue]
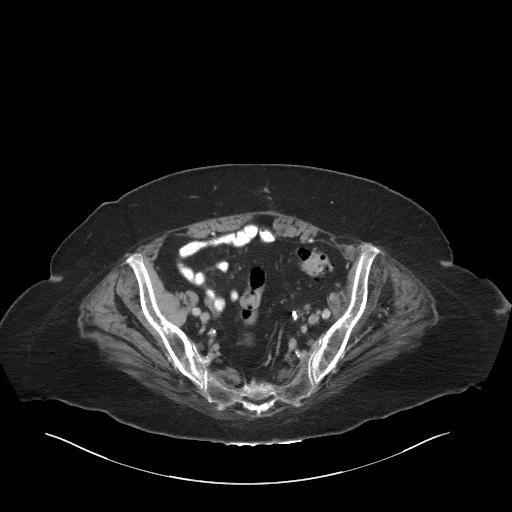
[im 88/175  soft-tissue]
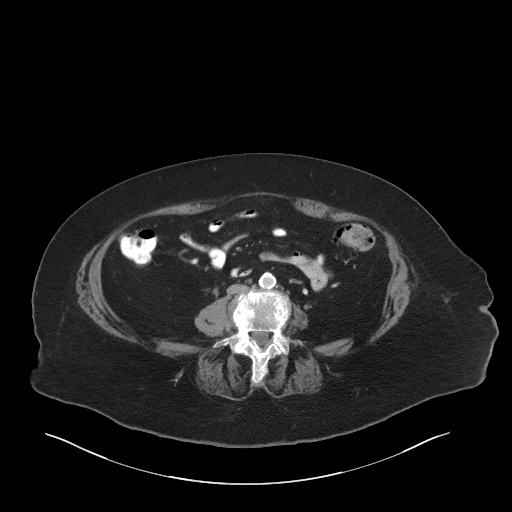
[im 117/175  soft-tissue]
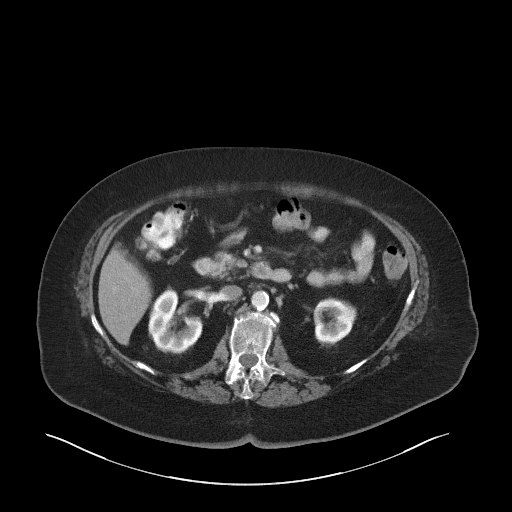
[im 146/175  soft-tissue]
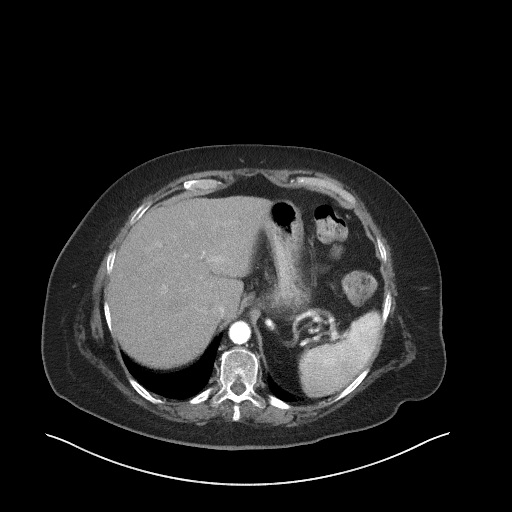

[Series 10: coronal arterial · coronal · arterial · 0.98mm/px · 1 of 118 slices shown, 2 images]
[im 59/118  soft-tissue]
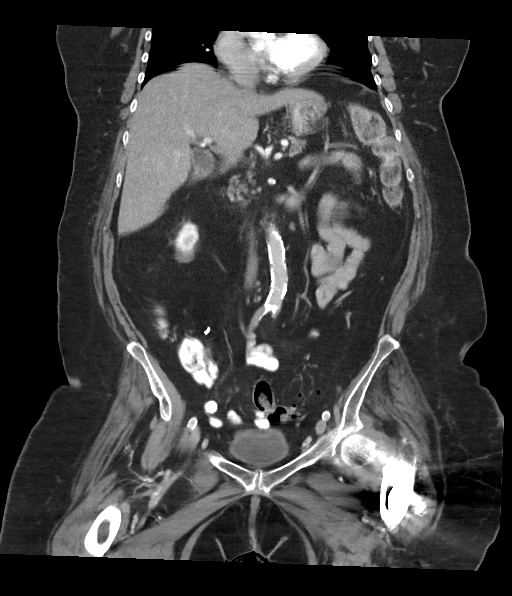
[im 59/118  bone]
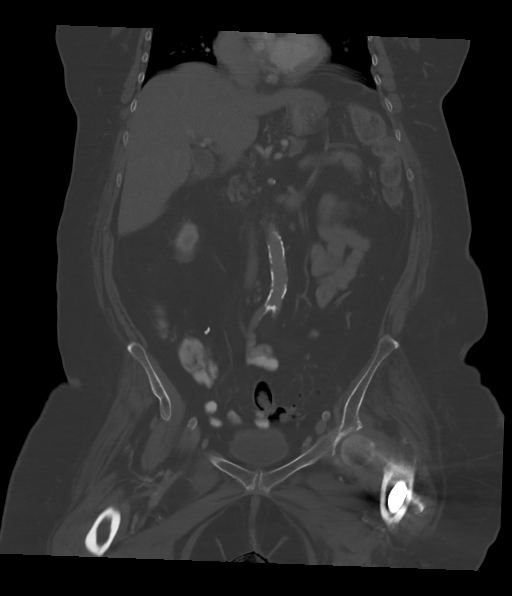

[Series 12: axial venous · axial · portal-venous · 0.93mm/px · z∈[-764,-416]mm · 5 of 175 slices shown, 10 images]
[im 30/175  soft-tissue]
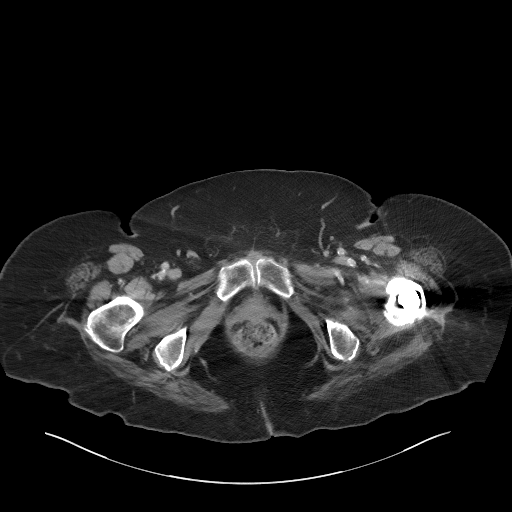
[im 30/175  bone]
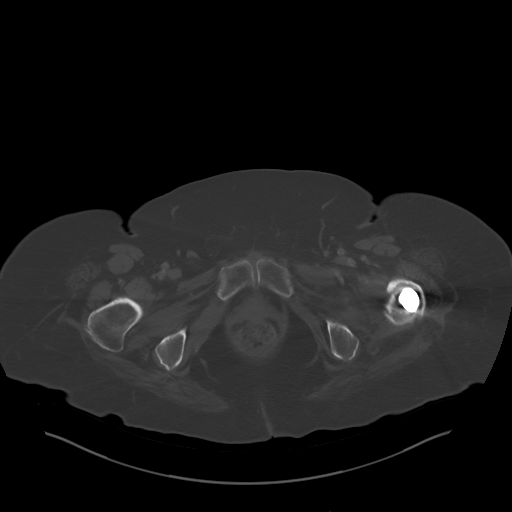
[im 59/175  soft-tissue]
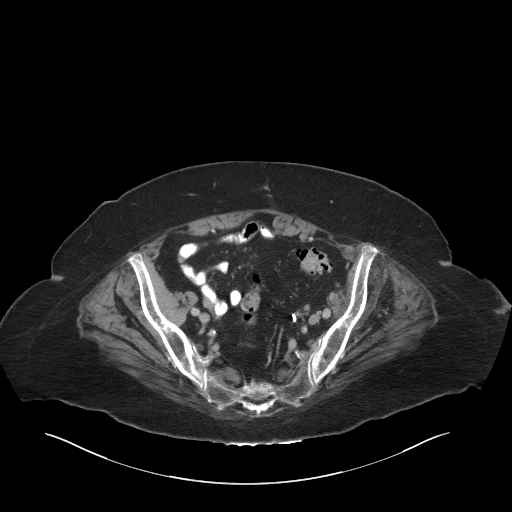
[im 59/175  lung]
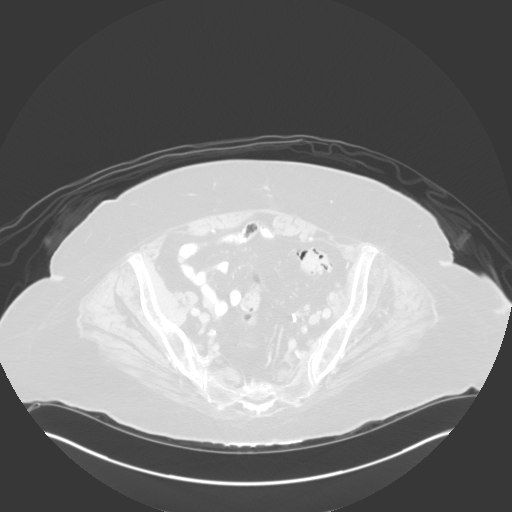
[im 88/175  soft-tissue]
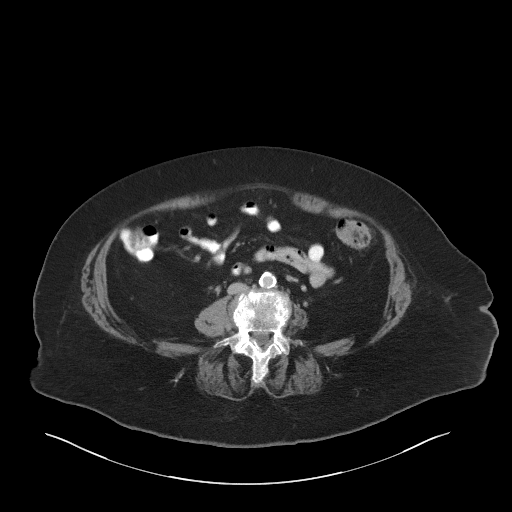
[im 88/175  lung]
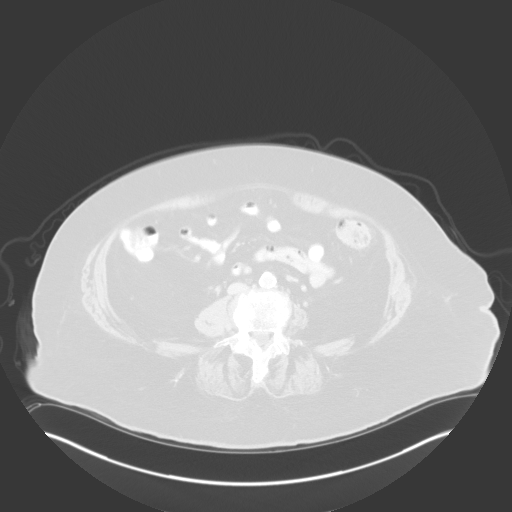
[im 117/175  soft-tissue]
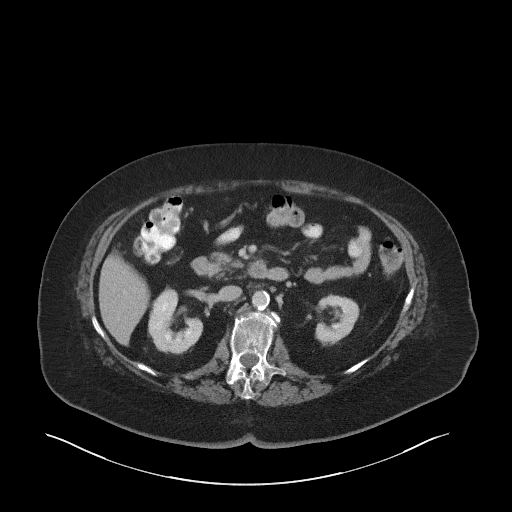
[im 117/175  lung]
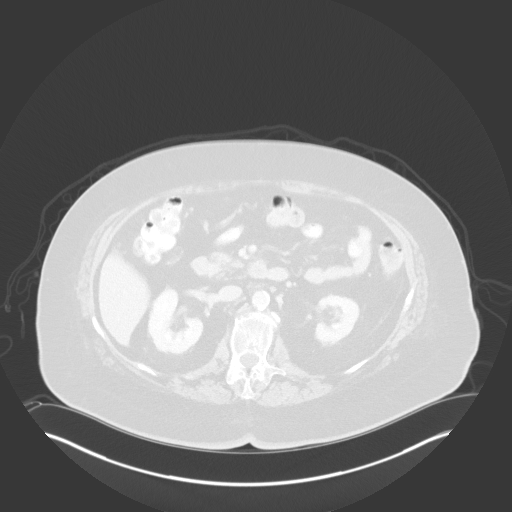
[im 146/175  soft-tissue]
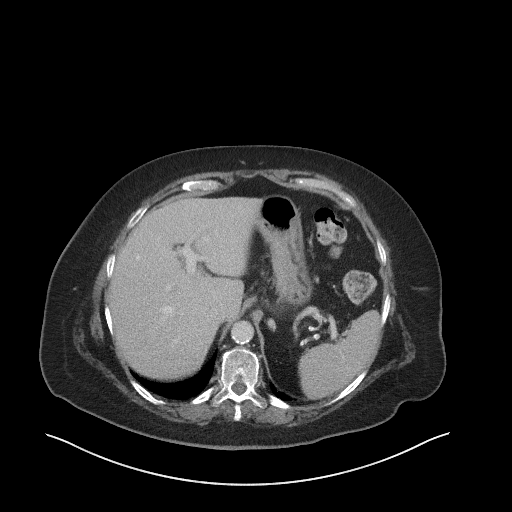
[im 146/175  lung]
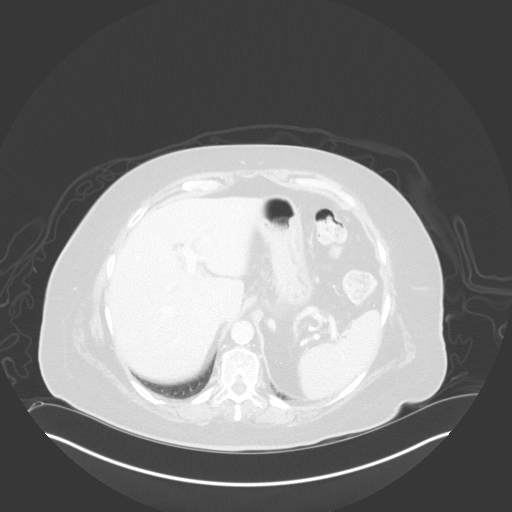

[11 of 46 positions shown; findings below may reference images not displayed]

FINDINGS: Lower chest: Lung bases are clear.

Hepatobiliary: Liver is within normal limits. No
suspicious/enhancing hepatic lesions.

2.2 x 1.6 cm enhancing polypoid lesion along the anterior
gallbladder wall (series 7/image 50). 4.3 x 1.0 cm enhancing
polypoid lesion along the posterior gallbladder wall (series 7/image
51). These are similar to the prior and remain highly suspicious for
gallbladder neoplasm.

No intrahepatic or extrahepatic ductal dilatation.

Pancreas: Within normal limits.

Spleen: Within normal limits.

Adrenals/Urinary Tract: Adrenal glands are within normal limits.

Kidneys are within normal limits.  No hydronephrosis.

Bladder is within normal limits.

Stomach/Bowel: Stomach is notable for a small hiatal hernia.

No evidence of bowel obstruction.

Prior appendectomy.

Left colonic diverticulosis, without evidence of diverticulitis.

Vascular/Lymphatic: No evidence of abdominal aortic aneurysm.

Atherosclerotic calcifications of the abdominal aorta and branch
vessels.

No suspicious abdominopelvic lymphadenopathy.

Reproductive: Uterus is within normal limits.

Bilateral ovaries are within normal limits.

Other: No abdominopelvic ascites.

Musculoskeletal: Mild degenerative changes of the visualized
thoracolumbar spine. Grade 1 spondylolisthesis at L5-S1.

Status post ORIF of the left hip. When compared to prior CT, there
has been loosening of the dynamic hip screw which has extended
superiorly beyond the expected contour of the femoral head (series
10/image 65).
IMPRESSION: Stable gallbladder masses, highly suspicious for gallbladder
neoplasm.

No findings suspicious for metastatic disease.

Status post ORIF of the left hip. When compared to the prior CT,
there has been loosening of the dynamic hip screw, as described
above. Follow-up orthopedic consultation is suggested.

These results will be called to the ordering clinician or
representative by the Radiologist Assistant, and communication
documented in the PACS or [REDACTED].

## 2021-04-15 MED ORDER — IOHEXOL 9 MG/ML PO SOLN
500.0000 mL | ORAL | Status: AC
  Administered 2021-04-15 (×2): 500 mL via ORAL

## 2021-04-15 MED ORDER — IOHEXOL 9 MG/ML PO SOLN
ORAL | Status: AC
  Filled 2021-04-15: qty 1000

## 2021-04-15 MED ORDER — IOHEXOL 350 MG/ML SOLN
100.0000 mL | Freq: Once | INTRAVENOUS | Status: AC | PRN
  Administered 2021-04-15: 100 mL via INTRAVENOUS

## 2021-07-27 ENCOUNTER — Ambulatory Visit: Payer: Medicare HMO | Admitting: Dermatology

## 2021-08-12 ENCOUNTER — Other Ambulatory Visit: Payer: Medicare HMO

## 2021-08-12 ENCOUNTER — Ambulatory Visit: Payer: Medicare HMO | Admitting: Internal Medicine

## 2021-09-10 ENCOUNTER — Inpatient Hospital Stay: Payer: Medicare HMO

## 2021-09-10 ENCOUNTER — Inpatient Hospital Stay: Payer: Medicare HMO | Admitting: Internal Medicine

## 2021-09-10 NOTE — Assessment & Plan Note (Deleted)
#   NEUTROPHILIA: Chronic white count anywhere between 12-13,000.  Baseline.  Appears reactive.  White count 17; BCR-ABL next visit  # Gallbladder mass/concerning for malignancy [facility] Feb 2022]/Dr.Cintron s/p surgical evaluation.  We will plan for surgery.  Discussed with patient's daughter  ## Left leg swelling ?DVT vs sec to Hip fracture/PT-declines ultrasound  # BCC of face: Recommend follow-up with dermatology; previously evaluated at [facility].  # DISPOSITION: [id number] # follow up in 6 months- MD; labs- cbc/cmp/CRP; Bcr-ABL FISH;

## 2021-09-28 ENCOUNTER — Ambulatory Visit: Payer: Medicare HMO | Admitting: Dermatology

## 2022-01-04 DEATH — deceased
